# Patient Record
Sex: Male | Born: 1955 | Race: White | Hispanic: No | Marital: Married | Smoking: Never smoker
Health system: Southern US, Community
[De-identification: ages and names within clinical notes are randomized; demographics above are authoritative.]

## PROBLEM LIST (undated history)

## (undated) DIAGNOSIS — E039 Hypothyroidism, unspecified: Secondary | ICD-10-CM

## (undated) HISTORY — PX: WISDOM TOOTH EXTRACTION: SHX21

## (undated) HISTORY — PX: HERNIA REPAIR: SHX51

## (undated) HISTORY — PX: VASECTOMY: SHX75

---

## 2007-11-09 ENCOUNTER — Ambulatory Visit: Payer: Self-pay | Admitting: Internal Medicine

## 2010-05-18 ENCOUNTER — Ambulatory Visit: Payer: Self-pay | Admitting: Surgery

## 2011-08-06 ENCOUNTER — Ambulatory Visit: Payer: Self-pay | Admitting: Gastroenterology

## 2011-08-06 LAB — HM COLONOSCOPY: HM Colonoscopy: NORMAL

## 2013-03-12 LAB — HM HEPATITIS C SCREENING LAB: HM HEPATITIS C SCREENING: NEGATIVE

## 2014-05-07 LAB — TSH: TSH: 3.21 u[IU]/mL (ref 0.41–5.90)

## 2014-05-07 LAB — LIPID PANEL
CHOLESTEROL: 225 mg/dL — AB (ref 0–200)
HDL: 70 mg/dL (ref 35–70)
LDL Cholesterol: 138 mg/dL
LDL/HDL RATIO: 2
TRIGLYCERIDES: 85 mg/dL (ref 40–160)

## 2014-05-07 LAB — BASIC METABOLIC PANEL
BUN: 16 mg/dL (ref 4–21)
Creatinine: 1.1 mg/dL (ref 0.6–1.3)
Glucose: 90 mg/dL
POTASSIUM: 4.3 mmol/L (ref 3.4–5.3)
SODIUM: 143 mmol/L (ref 137–147)

## 2014-05-07 LAB — HEPATIC FUNCTION PANEL
ALT: 16 U/L (ref 10–40)
AST: 22 U/L (ref 14–40)
Alkaline Phosphatase: 51 U/L (ref 25–125)
Bilirubin, Total: 1.2 mg/dL

## 2014-05-07 LAB — CBC AND DIFFERENTIAL
HEMATOCRIT: 44 % (ref 41–53)
HEMOGLOBIN: 15.3 g/dL (ref 13.5–17.5)
Neutrophils Absolute: 5 /uL
Platelets: 229 10*3/uL (ref 150–399)
WBC: 7.5 10^3/mL

## 2014-05-07 LAB — PSA: PSA: 1.5

## 2015-05-06 DIAGNOSIS — K469 Unspecified abdominal hernia without obstruction or gangrene: Secondary | ICD-10-CM | POA: Insufficient documentation

## 2015-05-12 ENCOUNTER — Ambulatory Visit (INDEPENDENT_AMBULATORY_CARE_PROVIDER_SITE_OTHER): Payer: BLUE CROSS/BLUE SHIELD | Admitting: Family Medicine

## 2015-05-12 ENCOUNTER — Encounter: Payer: Self-pay | Admitting: Family Medicine

## 2015-05-12 VITALS — BP 122/76 | HR 64 | Temp 98.0°F | Resp 16 | Ht 72.0 in | Wt 187.0 lb

## 2015-05-12 DIAGNOSIS — Z1211 Encounter for screening for malignant neoplasm of colon: Secondary | ICD-10-CM

## 2015-05-12 DIAGNOSIS — Z125 Encounter for screening for malignant neoplasm of prostate: Secondary | ICD-10-CM

## 2015-05-12 DIAGNOSIS — J309 Allergic rhinitis, unspecified: Secondary | ICD-10-CM

## 2015-05-12 DIAGNOSIS — Z0000 Encounter for general adult medical examination without abnormal findings: Secondary | ICD-10-CM

## 2015-05-12 LAB — POCT URINALYSIS DIPSTICK
BILIRUBIN UA: NEGATIVE
GLUCOSE UA: NEGATIVE
Ketones, UA: NEGATIVE
LEUKOCYTES UA: NEGATIVE
NITRITE UA: NEGATIVE
PH UA: 6
Protein, UA: NEGATIVE
RBC UA: NEGATIVE
Spec Grav, UA: 1.02
UROBILINOGEN UA: 0.2

## 2015-05-12 LAB — IFOBT (OCCULT BLOOD): IMMUNOLOGICAL FECAL OCCULT BLOOD TEST: NEGATIVE

## 2015-05-12 MED ORDER — LORATADINE 10 MG PO TABS: 10.0000 mg | ORAL_TABLET | Freq: Every day | ORAL | Status: DC

## 2015-05-12 MED ORDER — MONTELUKAST SODIUM 10 MG PO TABS: 10.0000 mg | ORAL_TABLET | Freq: Every day | ORAL | Status: DC

## 2015-05-12 NOTE — Progress Notes (Signed)
Patient ID: ZABDIEL DRIPPS, male   DOB: [birth year], 59 y.o.   MRN: [id number]       Patient: Peter Duncan, Male    DOB: 1955/11/09, 59 y.o.   MRN: [id number] Visit Date: 05/12/2015  Today's Provider: [name], MD   Chief Complaint  Patient presents with  . Annual Exam   Subjective:    Annual physical exam Peter Duncan is a 59 y.o. male who presents today for health maintenance and complete physical. He feels well. He reports he is not exercising. He reports he is sleeping well.  ----------------------------------------------------------------- Colonoscopy- 08/06/11 Normal repeat 10 years Tdap- 03/04/11 Zoster 04/23/13   Review of Systems  Constitutional: Negative.   HENT: Positive for postnasal drip.   Eyes: Negative.   Respiratory: Negative.   Cardiovascular: Negative.   Gastrointestinal: Negative.   Endocrine: Negative.   Genitourinary: Negative.   Musculoskeletal: Negative.   Skin: Negative.   Allergic/Immunologic: Negative.   Neurological: Negative.   Hematological: Negative.   Psychiatric/Behavioral: Negative.     Social History      He  reports that he has never smoked. He does not have any smokeless tobacco history on file. He reports that he does not drink alcohol or use illicit drugs.       Social History   Social History  . Marital Status: Married    Spouse Name: N/A  . Number of Children: N/A  . Years of Education: N/A   Social History Main Topics  . Smoking status: Never Smoker   . Smokeless tobacco: None  . Alcohol Use: No  . Drug Use: No  . Sexual Activity: Not Asked   Other Topics Concern  . None   Social History Narrative    Patient Active Problem List   Diagnosis Date Noted  . Abdominal hernia 05/06/2015    Past Surgical History  Procedure Laterality Date  . Vasectomy      Family History        Family Status  Relation Status Death Age  . Mother Alive   . Father Deceased   . Brother Alive     His family history includes COPD in his father; Diabetes in his mother; Heart disease in his father; Melanoma in his father.    No Known Allergies  Previous Medications   FEXOFENADINE (ALLEGRA) 180 MG TABLET    Take by mouth.    Patient Care Team: Maple Hudson., MD as PCP - General (Family Medicine)     Objective:   Vitals: BP 122/76 mmHg  Pulse 64  Temp(Src) 98 F (36.7 C) (Oral)  Resp 16  Ht 6' (1.829 m)  Wt 187 lb (84.823 kg)  BMI 25.36 kg/m2   Physical Exam  Constitutional: He is oriented to person, place, and time. He appears well-developed and well-nourished.  HENT:  Head: Normocephalic and atraumatic.  Right Ear: External ear normal.  Left Ear: External ear normal.  Nose: Nose normal.  Mouth/Throat: Oropharynx is clear and moist.  Eyes: Conjunctivae and EOM are normal. Pupils are equal, round, and reactive to light.  Neck: Normal range of motion. Neck supple.  Cardiovascular: Normal rate, regular rhythm, normal heart sounds and intact distal pulses.   Pulmonary/Chest: Effort normal and breath sounds normal.  Abdominal: Soft. Bowel sounds are normal.  Genitourinary: Rectum normal, prostate normal and penis normal.  Musculoskeletal: Normal range of motion.  Neurological: He is alert and oriented to person, place, and time. He has normal reflexes.  Skin: Skin is warm and dry.  Psychiatric: He has a normal mood and affect. His behavior is normal. Judgment and thought content normal.     Depression Screen PHQ 2/9 Scores 05/12/2015  PHQ - 2 Score 0      Assessment & Plan:     Routine Health Maintenance and Physical Exam  Exercise Activities and Dietary recommendations Goals    None      Immunization History  Administered Date(s) Administered  . Influenza,inj,Quad PF,36+ Mos 03/14/2015  . Tdap 03/04/2011  . Zoster 06/23/2012    Health Maintenance  Topic Date Due  . Hepatitis C Screening  June 25, 1955  . HIV Screening  04/07/1971  .  INFLUENZA VACCINE  01/13/2016  . TETANUS/TDAP  03/03/2021  . COLONOSCOPY  08/05/2021      Discussed health benefits of physical activity, and encouraged him to engage in regular exercise appropriate for his age and condition.    --------------------------------------------------------------------

## 2015-05-13 LAB — CBC WITH DIFFERENTIAL/PLATELET
BASOS: 0 %
Basophils Absolute: 0 10*3/uL (ref 0.0–0.2)
EOS (ABSOLUTE): 0.1 10*3/uL (ref 0.0–0.4)
EOS: 1 %
HEMATOCRIT: 45.3 % (ref 37.5–51.0)
HEMOGLOBIN: 15.4 g/dL (ref 12.6–17.7)
Immature Grans (Abs): 0 10*3/uL (ref 0.0–0.1)
Immature Granulocytes: 0 %
LYMPHS ABS: 2.4 10*3/uL (ref 0.7–3.1)
Lymphs: 27 %
MCH: 30.5 pg (ref 26.6–33.0)
MCHC: 34 g/dL (ref 31.5–35.7)
MCV: 90 fL (ref 79–97)
MONOCYTES: 9 %
MONOS ABS: 0.8 10*3/uL (ref 0.1–0.9)
NEUTROS ABS: 5.6 10*3/uL (ref 1.4–7.0)
Neutrophils: 63 %
Platelets: 236 10*3/uL (ref 150–379)
RBC: 5.05 x10E6/uL (ref 4.14–5.80)
RDW: 14 % (ref 12.3–15.4)
WBC: 8.8 10*3/uL (ref 3.4–10.8)

## 2015-05-13 LAB — COMPREHENSIVE METABOLIC PANEL
A/G RATIO: 2.1 (ref 1.1–2.5)
ALBUMIN: 4.8 g/dL (ref 3.5–5.5)
ALK PHOS: 58 IU/L (ref 39–117)
ALT: 15 IU/L (ref 0–44)
AST: 20 IU/L (ref 0–40)
BUN / CREAT RATIO: 15 (ref 9–20)
BUN: 16 mg/dL (ref 6–24)
Bilirubin Total: 1 mg/dL (ref 0.0–1.2)
CALCIUM: 9.9 mg/dL (ref 8.7–10.2)
CO2: 28 mmol/L (ref 18–29)
CREATININE: 1.05 mg/dL (ref 0.76–1.27)
Chloride: 98 mmol/L (ref 97–106)
GFR calc Af Amer: 89 mL/min/{1.73_m2} (ref >59)
GFR, EST NON AFRICAN AMERICAN: 77 mL/min/{1.73_m2} (ref >59)
GLOBULIN, TOTAL: 2.3 g/dL (ref 1.5–4.5)
Glucose: 90 mg/dL (ref 65–99)
POTASSIUM: 4.5 mmol/L (ref 3.5–5.2)
SODIUM: 142 mmol/L (ref 136–144)
Total Protein: 7.1 g/dL (ref 6.0–8.5)

## 2015-05-13 LAB — LIPID PANEL WITH LDL/HDL RATIO
Cholesterol, Total: 205 mg/dL — ABNORMAL HIGH (ref 100–199)
HDL: 68 mg/dL (ref >39)
LDL CALC: 114 mg/dL — AB (ref 0–99)
LDL/HDL RATIO: 1.7 ratio (ref 0.0–3.6)
Triglycerides: 116 mg/dL (ref 0–149)
VLDL Cholesterol Cal: 23 mg/dL (ref 5–40)

## 2015-05-13 LAB — TSH: TSH: 6.13 u[IU]/mL — AB (ref 0.450–4.500)

## 2015-05-13 LAB — PSA: Prostate Specific Ag, Serum: 1.4 ng/mL (ref 0.0–4.0)

## 2015-05-15 ENCOUNTER — Telehealth: Payer: Self-pay

## 2015-05-15 NOTE — Telephone Encounter (Signed)
-----   Message from Maple Hudsonichard L Gilbert Jr., MD sent at 05/15/2015  9:39 AM EST ----- Labs ok but now mildly hypothyroid--can Rx with synthroid 25 mcg or just f/u 423months--would f/u 3 months either way.

## 2015-05-15 NOTE — Telephone Encounter (Signed)
Tried calling patient, unable to leave VM. Will try again later.

## 2015-05-19 MED ORDER — LEVOTHYROXINE SODIUM 25 MCG PO CAPS: 1.0000 | ORAL_CAPSULE | Freq: Every day | ORAL | Status: DC

## 2015-05-19 NOTE — Telephone Encounter (Signed)
Pt advised, RX sent in, he will call back to make appt for f/u-aa

## 2015-05-27 ENCOUNTER — Other Ambulatory Visit: Payer: Self-pay

## 2015-05-27 DIAGNOSIS — E039 Hypothyroidism, unspecified: Secondary | ICD-10-CM

## 2015-05-27 MED ORDER — LEVOTHYROXINE SODIUM 25 MCG PO TABS: 25.0000 ug | ORAL_TABLET | Freq: Every day | ORAL | Status: DC

## 2015-05-28 ENCOUNTER — Telehealth: Payer: Self-pay | Admitting: Family Medicine

## 2015-08-27 ENCOUNTER — Ambulatory Visit (INDEPENDENT_AMBULATORY_CARE_PROVIDER_SITE_OTHER): Payer: BLUE CROSS/BLUE SHIELD | Admitting: Family Medicine

## 2015-08-27 ENCOUNTER — Encounter: Payer: Self-pay | Admitting: Family Medicine

## 2015-08-27 VITALS — BP 118/82 | HR 84 | Temp 97.9°F | Resp 18 | Wt 188.0 lb

## 2015-08-27 DIAGNOSIS — J45909 Unspecified asthma, uncomplicated: Secondary | ICD-10-CM | POA: Diagnosis not present

## 2015-08-27 DIAGNOSIS — R05 Cough: Secondary | ICD-10-CM

## 2015-08-27 DIAGNOSIS — R059 Cough, unspecified: Secondary | ICD-10-CM

## 2015-08-27 MED ORDER — PREDNISONE 10 MG (21) PO TBPK: 10.0000 mg | ORAL_TABLET | Freq: Every day | ORAL | Status: DC

## 2015-08-27 MED ORDER — LEVOFLOXACIN 500 MG PO TABS: 500.0000 mg | ORAL_TABLET | Freq: Every day | ORAL | Status: DC

## 2015-08-27 NOTE — Progress Notes (Signed)
Patient ID: Peter Duncan, male   DOB: 09-Oct-1955, 60 y.o.   MRN: [id number]    Subjective:  HPI Pt is here for cough and chest congestion. He was seen at urgent care over this past weekend. He was given Z-pak, pro-air and a cough syrup. He is not feeling any better. He can hear himself wheeze. He denies shortness of breath, or fevers.  Sputum is a yellowish white color.   Prior to Admission medications   Medication Sig Start Date End Date Taking? Authorizing Provider  levothyroxine (SYNTHROID, LEVOTHROID) 25 MCG tablet Take 1 tablet (25 mcg total) by mouth daily before breakfast. 05/27/15  Yes Maple Hudson., MD  loratadine (CLARITIN) 10 MG tablet Take 1 tablet (10 mg total) by mouth daily. 05/12/15  Yes Richard Hulen Shouts., MD  montelukast (SINGULAIR) 10 MG tablet Take 1 tablet (10 mg total) by mouth at bedtime. Patient not taking: Reported on 08/27/2015 05/12/15   Maple Hudson., MD    Patient Active Problem List   Diagnosis Date Noted  . Abdominal hernia 05/06/2015    History reviewed. No pertinent past medical history.  Social History   Social History  . Marital Status: Married    Spouse Name: N/A  . Number of Children: N/A  . Years of Education: N/A   Occupational History  . Not on file.   Social History Main Topics  . Smoking status: Never Smoker   . Smokeless tobacco: Not on file  . Alcohol Use: No  . Drug Use: No  . Sexual Activity: Not on file   Other Topics Concern  . Not on file   Social History Narrative    No Known Allergies  Review of Systems  Constitutional: Positive for malaise/fatigue.  HENT: Negative.   Eyes: Negative.   Respiratory: Positive for cough and sputum production (yellow/white color).   Cardiovascular: Negative.   Gastrointestinal: Negative.   Genitourinary: Negative.   Musculoskeletal: Negative.   Skin: Negative.   Neurological: Negative.   Endo/Heme/Allergies: Negative.   Psychiatric/Behavioral: Negative.       Immunization History  Administered Date(s) Administered  . Influenza,inj,Quad PF,36+ Mos 03/14/2015  . Tdap 03/04/2011  . Zoster 06/23/2012   Objective:  BP 118/82 mmHg  Pulse 84  Temp(Src) 97.9 F (36.6 C) (Oral)  Resp 18  Wt 188 lb (85.276 kg)  SpO2 98%  Physical Exam  Constitutional: He is oriented to person, place, and time and well-developed, well-nourished, and in no distress.  HENT:  Head: Normocephalic and atraumatic.  Right Ear: External ear normal.  Left Ear: External ear normal.  Nose: Nose normal.  Eyes: Conjunctivae are normal.  Neck: Neck supple.  Cardiovascular: Normal rate, regular rhythm, normal heart sounds and intact distal pulses.   Pulmonary/Chest: Effort normal and breath sounds normal.  Abdominal: Soft.  Neurological: He is alert and oriented to person, place, and time.  Skin: Skin is warm and dry.  Psychiatric: Mood, memory, affect and judgment normal.    Lab Results  Component Value Date   WBC 8.8 05/12/2015   HGB 15.3 05/07/2014   HCT 45.3 05/12/2015   PLT 236 05/12/2015   GLUCOSE 90 05/12/2015   CHOL 205* 05/12/2015   TRIG 116 05/12/2015   HDL 68 05/12/2015   LDLCALC 114* 05/12/2015   TSH 6.130* 05/12/2015   PSA 1.5 05/07/2014    CMP     Component Value Date/Time   NA 142 05/12/2015 1512   K 4.5 05/12/2015 1512  CL 98 05/12/2015 1512   CO2 28 05/12/2015 1512   GLUCOSE 90 05/12/2015 1512   BUN 16 05/12/2015 1512   CREATININE 1.05 05/12/2015 1512   CREATININE 1.1 05/07/2014   CALCIUM 9.9 05/12/2015 1512   PROT 7.1 05/12/2015 1512   ALBUMIN 4.8 05/12/2015 1512   AST 20 05/12/2015 1512   ALT 15 05/12/2015 1512   ALKPHOS 58 05/12/2015 1512   BILITOT 1.0 05/12/2015 1512   GFRNONAA 77 05/12/2015 1512   GFRAA 89 05/12/2015 1512    Assessment and Plan :  1. Cough   2. Asthmatic bronchitis, unspecified asthma severity, uncomplicated  - predniSONE (STERAPRED UNI-PAK 21 TAB) 10 MG (21) TBPK tablet; Take 1 tablet (10 mg  total) by mouth daily.  Dispense: 21 tablet; Refill: 0 - levofloxacin (LEVAQUIN) 500 MG tablet; Take 1 tablet (500 mg total) by mouth daily.  Dispense: 7 tablet; Refill: 0   Julieanne Mansonichard Gilbert MD [facility] 08/27/2015 3:39 PM

## 2015-08-28 ENCOUNTER — Other Ambulatory Visit: Payer: Self-pay

## 2015-08-28 DIAGNOSIS — J45901 Unspecified asthma with (acute) exacerbation: Secondary | ICD-10-CM

## 2015-08-28 MED ORDER — ALBUTEROL SULFATE HFA 108 (90 BASE) MCG/ACT IN AERS
1.0000 | INHALATION_SPRAY | RESPIRATORY_TRACT | Status: DC | PRN
Start: 2015-08-28 — End: 2017-08-18

## 2015-08-28 MED ORDER — ALBUTEROL SULFATE HFA 108 (90 BASE) MCG/ACT IN AERS: 1.0000 | INHALATION_SPRAY | RESPIRATORY_TRACT | Status: DC | PRN

## 2015-10-02 ENCOUNTER — Other Ambulatory Visit: Payer: Self-pay | Admitting: Family Medicine

## 2015-11-05 ENCOUNTER — Ambulatory Visit (INDEPENDENT_AMBULATORY_CARE_PROVIDER_SITE_OTHER): Payer: BLUE CROSS/BLUE SHIELD | Admitting: Family Medicine

## 2015-11-05 ENCOUNTER — Encounter: Payer: Self-pay | Admitting: Family Medicine

## 2015-11-05 VITALS — BP 112/70 | HR 98 | Temp 98.4°F | Resp 16 | Wt 188.0 lb

## 2015-11-05 DIAGNOSIS — J40 Bronchitis, not specified as acute or chronic: Secondary | ICD-10-CM | POA: Diagnosis not present

## 2015-11-05 MED ORDER — DOXYCYCLINE HYCLATE 100 MG PO TABS: 100.0000 mg | ORAL_TABLET | Freq: Two times a day (BID) | ORAL | Status: DC

## 2015-11-05 NOTE — Patient Instructions (Signed)
Add Mucinex for congestion and Delsym for daytime cough. Continue to use narcotic cough syrup for night time cough. If symptoms not improving over the next few days may add antibiotic.

## 2015-11-05 NOTE — Progress Notes (Signed)
Subjective:     Patient ID: Peter Duncan, male   DOB: Dec 28, 1955, 60 y.o.   MRN: [id number]  HPI  Chief Complaint  Patient presents with  . Cough    Patient comes in office today with concerns of cough and congestion for the past two weeks, he denies symptoms of shortness of breath of wheezing. No other associated URI symptoms with cough, patient reports that he has been taking otc cough syrup and using Pro air inhaler for relief.   States he is bringing up little sputum which he describes as green. No hx of asthma or smoking. Remains on Singulair for allergies. Has left over rx cough syrup at home.   Review of Systems     Objective:   Physical Exam  Constitutional: He appears well-developed and well-nourished. No distress.  Ears: T.M's intact without inflammation Throat: no tonsillar enlargement or exudate Neck: no cervical adenopathy Lungs: Posterior  Basilar coarse crackles     Assessment:    1. Bronchitis - doxycycline (VIBRA-TABS) 100 MG tablet; Take 1 tablet (100 mg total) by mouth 2 (two) times daily.  Dispense: 14 tablet; Refill: 0    Plan:    Discussed use of Mucinex and Delsym. If cough not improving over the next few days to start abx.

## 2016-03-26 DIAGNOSIS — Z23 Encounter for immunization: Secondary | ICD-10-CM | POA: Diagnosis not present

## 2016-05-12 ENCOUNTER — Ambulatory Visit (INDEPENDENT_AMBULATORY_CARE_PROVIDER_SITE_OTHER): Payer: BLUE CROSS/BLUE SHIELD | Admitting: Family Medicine

## 2016-05-12 ENCOUNTER — Encounter: Payer: Self-pay | Admitting: Family Medicine

## 2016-05-12 VITALS — BP 116/78 | HR 72 | Temp 98.2°F | Resp 16 | Ht 71.25 in | Wt 185.6 lb

## 2016-05-12 DIAGNOSIS — Z0000 Encounter for general adult medical examination without abnormal findings: Secondary | ICD-10-CM

## 2016-05-12 DIAGNOSIS — Z125 Encounter for screening for malignant neoplasm of prostate: Secondary | ICD-10-CM | POA: Diagnosis not present

## 2016-05-12 DIAGNOSIS — R0681 Apnea, not elsewhere classified: Secondary | ICD-10-CM | POA: Diagnosis not present

## 2016-05-12 DIAGNOSIS — Z1211 Encounter for screening for malignant neoplasm of colon: Secondary | ICD-10-CM

## 2016-05-12 LAB — POCT URINALYSIS DIPSTICK
Bilirubin, UA: NEGATIVE
Blood, UA: NEGATIVE
Glucose, UA: NEGATIVE
Ketones, UA: NEGATIVE
LEUKOCYTES UA: NEGATIVE
NITRITE UA: NEGATIVE
PROTEIN UA: NEGATIVE
UROBILINOGEN UA: 0.2
pH, UA: 6

## 2016-05-12 LAB — IFOBT (OCCULT BLOOD): IMMUNOLOGICAL FECAL OCCULT BLOOD TEST: NEGATIVE

## 2016-05-12 NOTE — Progress Notes (Signed)
Patient: Peter Duncan, Male    DOB: 1955/10/13, 60 y.o.   MRN: [id number] Visit Date: 05/12/2016  Today's Provider: Megan Mansichard Gilbert Jr, MD   Chief Complaint  Patient presents with  . Annual Exam   Subjective:    Annual physical exam Peter CongGregory L Duncan is a 60 y.o. male who presents today for health maintenance and complete physical. He feels well. He reports exercising. He reports he is sleeping poor to fair. Pt says he snores and wife says he has apneic spells during the  night.  ----------------------------------------------------------------- Colonoscopy: 08/06/2011- Normal  Epworth Score: 12   Review of Systems  Constitutional: Negative.   HENT: Negative.   Eyes: Negative.   Respiratory: Negative.   Cardiovascular: Negative.   Gastrointestinal: Negative.   Endocrine: Negative.   Genitourinary: Negative.   Musculoskeletal: Negative.   Skin: Negative.   Neurological: Negative.   Hematological: Negative.   Psychiatric/Behavioral: Positive for sleep disturbance.    Social History      He  reports that he has never smoked. He does not have any smokeless tobacco history on file. He reports that he does not drink alcohol or use drugs.       Social History   Social History  . Marital status: Married    Spouse name: N/A  . Number of children: N/A  . Years of education: N/A   Social History Main Topics  . Smoking status: Never Smoker  . Smokeless tobacco: None  . Alcohol use No  . Drug use: No  . Sexual activity: Not Asked   Other Topics Concern  . None   Social History Narrative  . None    No past medical history on file.   Patient Active Problem List   Diagnosis Date Noted  . Abdominal hernia 05/06/2015    Past Surgical History:  Procedure Laterality Date  . VASECTOMY      Family History        Family Status  Relation Status  . Mother Alive  . Father Deceased  . Brother Alive        His family history includes COPD in his  father; Diabetes in his mother; Heart disease in his father; Melanoma in his father.     No Known Allergies   Current Outpatient Prescriptions:  .  albuterol (PROAIR HFA) 108 (90 Base) MCG/ACT inhaler, Inhale 1-2 puffs into the lungs every 4 (four) hours as needed for wheezing or shortness of breath., Disp: 1 Inhaler, Rfl: 12 .  levothyroxine (SYNTHROID, LEVOTHROID) 25 MCG tablet, TAKE ONE TABLET BY MOUTH ONCE DAILY BEFORE BREAKFAST, Disp: 30 tablet, Rfl: 12 .  loratadine (CLARITIN) 10 MG tablet, Take 1 tablet (10 mg total) by mouth daily., Disp: 30 tablet, Rfl: 12 .  montelukast (SINGULAIR) 10 MG tablet, Take 1 tablet (10 mg total) by mouth at bedtime., Disp: 30 tablet, Rfl: 12   Patient Care Team: Maple Hudsonichard L Gilbert Jr., MD as PCP - General (Family Medicine)      Objective:   Vitals: BP 116/78 (BP Location: Right Arm, Patient Position: Sitting, Cuff Size: Normal)   Pulse 72   Temp 98.2 F (36.8 C) (Oral)   Resp 16   Ht 5' 11.25" (1.81 m)   Wt 185 lb 9.6 oz (84.2 kg)   BMI 25.70 kg/m    Physical Exam  Constitutional: He is oriented to person, place, and time. He appears well-developed and well-nourished.  HENT:  Head: Normocephalic and atraumatic.  Right Ear: External ear normal.  Left Ear: External ear normal.  Mouth/Throat: Oropharynx is clear and moist.  Eyes: Conjunctivae and EOM are normal. Pupils are equal, round, and reactive to light.  Neck: Normal range of motion. Neck supple.  Cardiovascular: Normal rate, regular rhythm, normal heart sounds and intact distal pulses.   Pulmonary/Chest: Effort normal and breath sounds normal.  Abdominal: Soft. Bowel sounds are normal.  Musculoskeletal: Normal range of motion.  Neurological: He is alert and oriented to person, place, and time. He has normal reflexes.  Skin: Skin is warm and dry.  Psychiatric: He has a normal mood and affect. His behavior is normal. Judgment and thought content normal.     Depression Screen PHQ  2/9 Scores 05/12/2015  PHQ - 2 Score 0      Assessment & Plan:     Routine Health Maintenance and Physical Exam  Exercise Activities and Dietary recommendations Goals    None      Immunization History  Administered Date(s) Administered  . Influenza,inj,Quad PF,36+ Mos 03/14/2015  . Influenza,inj,quad, With Preservative 03/26/2016  . Tdap 03/04/2011  . Zoster 06/23/2012    Health Maintenance  Topic Date Due  . Hepatitis C Screening  Jan 03, 1956  . HIV Screening  04/07/1971  . TETANUS/TDAP  03/03/2021  . COLONOSCOPY  08/05/2021  . INFLUENZA VACCINE  Completed  . ZOSTAVAX  Completed     Discussed health benefits of physical activity, and encouraged him to engage in regular exercise appropriate for his age and condition.  OSA -likely with Epworth of 12 ,snoring,apneic spells. Obtain sleep sutdy.     --------------------------------------------------------------------  I have done the exam and reviewed the above chart and it is accurate to the best of my knowledge. [employer]  technology has been used in this note in any air is in the dictation or transcription are unintentional.   Megan Mansichard Gilbert Jr, MD  [facility]

## 2016-05-13 ENCOUNTER — Telehealth: Payer: Self-pay

## 2016-05-13 LAB — COMPREHENSIVE METABOLIC PANEL
A/G RATIO: 2 (ref 1.2–2.2)
ALK PHOS: 52 IU/L (ref 39–117)
ALT: 20 IU/L (ref 0–44)
AST: 22 IU/L (ref 0–40)
Albumin: 4.5 g/dL (ref 3.6–4.8)
BILIRUBIN TOTAL: 0.8 mg/dL (ref 0.0–1.2)
BUN/Creatinine Ratio: 17 (ref 10–24)
BUN: 19 mg/dL (ref 8–27)
CO2: 27 mmol/L (ref 18–29)
Calcium: 10 mg/dL (ref 8.6–10.2)
Chloride: 100 mmol/L (ref 96–106)
Creatinine, Ser: 1.15 mg/dL (ref 0.76–1.27)
GFR calc Af Amer: 80 mL/min/{1.73_m2} (ref >59)
GFR calc non Af Amer: 69 mL/min/{1.73_m2} (ref >59)
GLOBULIN, TOTAL: 2.2 g/dL (ref 1.5–4.5)
Glucose: 86 mg/dL (ref 65–99)
POTASSIUM: 4.8 mmol/L (ref 3.5–5.2)
SODIUM: 143 mmol/L (ref 134–144)
Total Protein: 6.7 g/dL (ref 6.0–8.5)

## 2016-05-13 LAB — CBC WITH DIFFERENTIAL
BASOS ABS: 0 10*3/uL (ref 0.0–0.2)
Basos: 0 %
EOS (ABSOLUTE): 0.1 10*3/uL (ref 0.0–0.4)
Eos: 2 %
Hematocrit: 42 % (ref 37.5–51.0)
Hemoglobin: 14.3 g/dL (ref 12.6–17.7)
IMMATURE GRANS (ABS): 0 10*3/uL (ref 0.0–0.1)
Immature Granulocytes: 0 %
LYMPHS ABS: 2.7 10*3/uL (ref 0.7–3.1)
LYMPHS: 32 %
MCH: 31.4 pg (ref 26.6–33.0)
MCHC: 34 g/dL (ref 31.5–35.7)
MCV: 92 fL (ref 79–97)
Monocytes Absolute: 0.7 10*3/uL (ref 0.1–0.9)
Monocytes: 9 %
NEUTROS ABS: 4.7 10*3/uL (ref 1.4–7.0)
NEUTROS PCT: 57 %
RBC: 4.55 x10E6/uL (ref 4.14–5.80)
RDW: 14.2 % (ref 12.3–15.4)
WBC: 8.2 10*3/uL (ref 3.4–10.8)

## 2016-05-13 LAB — LIPID PANEL
CHOLESTEROL TOTAL: 211 mg/dL — AB (ref 100–199)
Chol/HDL Ratio: 2.7 ratio units (ref 0.0–5.0)
HDL: 78 mg/dL (ref >39)
LDL Calculated: 122 mg/dL — ABNORMAL HIGH (ref 0–99)
TRIGLYCERIDES: 53 mg/dL (ref 0–149)
VLDL Cholesterol Cal: 11 mg/dL (ref 5–40)

## 2016-05-13 LAB — TSH: TSH: 3.8 u[IU]/mL (ref 0.450–4.500)

## 2016-05-13 LAB — PSA: Prostate Specific Ag, Serum: 1.4 ng/mL (ref 0.0–4.0)

## 2016-05-13 NOTE — Telephone Encounter (Signed)
Patients wife was advised, she would like to know if Dr. Sullivan LoneGilbert has completed forms and when will they be available for pick up? Wife is also requesting that patients Synthroid be refilled as a 90 day prescription.KW

## 2016-05-13 NOTE — Telephone Encounter (Signed)
-----   Message from Maple Hudsonichard L Gilbert Jr., MD sent at 05/13/2016  9:23 AM EST ----- Labs OK

## 2016-05-13 NOTE — Telephone Encounter (Signed)
Monday will be done

## 2016-05-13 NOTE — Telephone Encounter (Signed)
Patients wife was advised. KW 

## 2016-05-14 ENCOUNTER — Other Ambulatory Visit: Payer: Self-pay | Admitting: Emergency Medicine

## 2016-05-14 MED ORDER — LORATADINE 10 MG PO TABS: 10.0000 mg | ORAL_TABLET | Freq: Every day | ORAL | 3 refills | Status: DC

## 2016-06-28 ENCOUNTER — Telehealth: Payer: Self-pay | Admitting: Family Medicine

## 2016-06-28 NOTE — Telephone Encounter (Signed)
FYI--Per Lillia AbedLindsay at [facility]Their office has made several attempts to contact pt including mailing a letter to pt's home.Pt has not responded

## 2016-06-28 NOTE — Telephone Encounter (Signed)
See below-aa 

## 2016-10-04 ENCOUNTER — Other Ambulatory Visit: Payer: Self-pay | Admitting: Family Medicine

## 2017-03-20 DIAGNOSIS — Z23 Encounter for immunization: Secondary | ICD-10-CM | POA: Diagnosis not present

## 2017-06-01 ENCOUNTER — Other Ambulatory Visit: Payer: Self-pay

## 2017-06-01 MED ORDER — LEVOTHYROXINE SODIUM 25 MCG PO TABS: ORAL_TABLET | ORAL | 0 refills | Status: DC

## 2017-06-09 ENCOUNTER — Ambulatory Visit (INDEPENDENT_AMBULATORY_CARE_PROVIDER_SITE_OTHER): Payer: BLUE CROSS/BLUE SHIELD | Admitting: Family Medicine

## 2017-06-09 ENCOUNTER — Other Ambulatory Visit: Payer: Self-pay

## 2017-06-09 VITALS — BP 114/78 | HR 96 | Temp 98.4°F | Resp 14 | Wt 188.0 lb

## 2017-06-09 DIAGNOSIS — Z125 Encounter for screening for malignant neoplasm of prostate: Secondary | ICD-10-CM

## 2017-06-09 DIAGNOSIS — Z1211 Encounter for screening for malignant neoplasm of colon: Secondary | ICD-10-CM | POA: Diagnosis not present

## 2017-06-09 DIAGNOSIS — Z0000 Encounter for general adult medical examination without abnormal findings: Secondary | ICD-10-CM | POA: Diagnosis not present

## 2017-06-09 LAB — POCT URINALYSIS DIPSTICK
BILIRUBIN UA: NEGATIVE
Blood, UA: NEGATIVE
GLUCOSE UA: NEGATIVE
KETONES UA: NEGATIVE
Leukocytes, UA: NEGATIVE
Nitrite, UA: NEGATIVE
PH UA: 6.5 (ref 5.0–8.0)
Protein, UA: NEGATIVE
Spec Grav, UA: 1.01 (ref 1.010–1.025)
UROBILINOGEN UA: 0.2 U/dL

## 2017-06-09 LAB — IFOBT (OCCULT BLOOD): IFOBT: NEGATIVE

## 2017-06-09 NOTE — Progress Notes (Signed)
Patient: Peter Duncan, Male    DOB: 03-Jun-1956, 61 y.o.   MRN: [id number] Visit Date: 06/09/2017  Today's Provider: Megan Mansichard Landry Lookingbill Jr, MD   Chief Complaint  Patient presents with  . Annual Exam   Subjective:  Peter CongGregory L Mollenkopf is a 61 y.o. male who presents today for health maintenance and complete physical. He feels well. He reports exercising weekly. He reports he is sleeping poorly.He feels well. His wife retired a few years ago and he plans to retire October 2019.  Immunization History  Administered Date(s) Administered  . Influenza Inj Mdck Quad Pf 03/20/2017  . Influenza,inj,Quad PF,6+ Mos 03/14/2015  . Influenza,inj,quad, With Preservative 03/26/2016  . Tdap 03/04/2011  . Zoster 06/23/2012   08/06/11 Colonoscopy, Dr Ellard Artish-normal, repeat 2023  Review of Systems  Constitutional: Negative.   HENT: Negative.   Eyes: Negative.   Respiratory: Negative.   Cardiovascular: Negative.   Gastrointestinal: Negative.   Endocrine: Negative.   Genitourinary: Negative.   Musculoskeletal: Negative.   Skin: Negative.   Allergic/Immunologic: Negative.   Neurological: Negative.   Hematological: Negative.   Psychiatric/Behavioral: Negative.     Social History   Socioeconomic History  . Marital status: Married    Spouse name: Not on file  . Number of children: Not on file  . Years of education: Not on file  . Highest education level: Not on file  Social Needs  . Financial resource strain: Not on file  . Food insecurity - worry: Not on file  . Food insecurity - inability: Not on file  . Transportation needs - medical: Not on file  . Transportation needs - non-medical: Not on file  Occupational History  . Not on file  Tobacco Use  . Smoking status: Never Smoker  Substance and Sexual Activity  . Alcohol use: No  . Drug use: No  . Sexual activity: Not on file  Other Topics Concern  . Not on file  Social History Narrative  . Not on file    Patient Active Problem List   Diagnosis Date Noted  . Witnessed episode of apnea 05/12/2016  . Abdominal hernia 05/06/2015    Past Surgical History:  Procedure Laterality Date  . VASECTOMY      His family history includes COPD in his father; Diabetes in his mother; Heart disease in his father; Melanoma in his father.     Outpatient Encounter Medications as of 06/09/2017  Medication Sig  . albuterol (PROAIR HFA) 108 (90 Base) MCG/ACT inhaler Inhale 1-2 puffs into the lungs every 4 (four) hours as needed for wheezing or shortness of breath.  . levothyroxine (SYNTHROID, LEVOTHROID) 25 MCG tablet TAKE ONE TABLET BY MOUTH ONCE DAILY BEFORE BREAKFAST  . loratadine (CLARITIN) 10 MG tablet Take 1 tablet (10 mg total) by mouth daily.  . montelukast (SINGULAIR) 10 MG tablet Take 1 tablet (10 mg total) by mouth at bedtime.   No facility-administered encounter medications on file as of 06/09/2017.     Patient Care Team: Maple HudsonGilbert, Leomar Westberg L Jr., MD as PCP - General (Family Medicine)      Objective:   Vitals:  Vitals:   06/09/17 1419  BP: 114/78  Pulse: 96  Resp: 14  Temp: 98.4 F (36.9 C)  TempSrc: Oral  Weight: 188 lb (85.3 kg)    Physical Exam  Constitutional: He is oriented to person, place, and time. He appears well-developed and well-nourished.  HENT:  Head: Normocephalic and atraumatic.  Right Ear: External ear normal.  Left Ear: External  ear normal.  Nose: Nose normal.  Mouth/Throat: Oropharynx is clear and moist.  Eyes: Conjunctivae and EOM are normal. Pupils are equal, round, and reactive to light.  Neck: Normal range of motion. Neck supple.  Cardiovascular: Normal rate, regular rhythm, normal heart sounds and intact distal pulses.  LE varicose veins present.  Pulmonary/Chest: Effort normal and breath sounds normal.  Abdominal: Soft. Bowel sounds are normal.  Genitourinary: Rectum normal, prostate normal and penis normal.  Musculoskeletal: Normal range of motion.  Neurological: He is alert and  oriented to person, place, and time.  Skin: Skin is warm and dry.  Pt has several stable lipomas.  Psychiatric: He has a normal mood and affect. His behavior is normal. Judgment and thought content normal.   Fall Risk  06/09/2017 05/12/2015  Falls in the past year? No No    Depression Screen PHQ 2/9 Scores 06/09/2017 05/12/2015  PHQ - 2 Score 2 0  PHQ- 9 Score 5 -   Functional Status Survey: Is the patient deaf or have difficulty hearing?: No Does the patient have difficulty seeing, even when wearing glasses/contacts?: No Does the patient have difficulty concentrating, remembering, or making decisions?: No Does the patient have difficulty walking or climbing stairs?: No Does the patient have difficulty dressing or bathing?: No Does the patient have difficulty doing errands alone such as visiting a doctor's office or shopping?: No  Current Exercise Habits: Home exercise routine, Frequency (Times/Week): 1      Assessment & Plan:     Routine Health Maintenance and Physical Exam  Exercise Activities and Dietary recommendations Goals    None      Immunization History  Administered Date(s) Administered  . Influenza Inj Mdck Quad Pf 03/20/2017  . Influenza,inj,Quad PF,6+ Mos 03/14/2015  . Influenza,inj,quad, With Preservative 03/26/2016  . Tdap 03/04/2011  . Zoster 06/23/2012    Health Maintenance  Topic Date Due  . Hepatitis C Screening  11-Feb-1956  . HIV Screening  04/07/1971  . TETANUS/TDAP  03/03/2021  . COLONOSCOPY  08/05/2021  . INFLUENZA VACCINE  Completed     Discussed health benefits of physical activity, and encouraged him to engage in regular exercise appropriate for his age and condition.  RTC 1 year.

## 2017-06-10 LAB — CBC WITH DIFFERENTIAL/PLATELET
Basophils Absolute: 41 cells/uL (ref 0–200)
Basophils Relative: 0.6 %
EOS PCT: 3.2 %
Eosinophils Absolute: 221 cells/uL (ref 15–500)
HCT: 44.5 % (ref 38.5–50.0)
Hemoglobin: 15.2 g/dL (ref 13.2–17.1)
Lymphs Abs: 2022 cells/uL (ref 850–3900)
MCH: 30.8 pg (ref 27.0–33.0)
MCHC: 34.2 g/dL (ref 32.0–36.0)
MCV: 90.1 fL (ref 80.0–100.0)
MONOS PCT: 8 %
MPV: 11 fL (ref 7.5–12.5)
NEUTROS PCT: 58.9 %
Neutro Abs: 4064 cells/uL (ref 1500–7800)
PLATELETS: 239 10*3/uL (ref 140–400)
RBC: 4.94 10*6/uL (ref 4.20–5.80)
RDW: 12.9 % (ref 11.0–15.0)
TOTAL LYMPHOCYTE: 29.3 %
WBC mixed population: 552 cells/uL (ref 200–950)
WBC: 6.9 10*3/uL (ref 3.8–10.8)

## 2017-06-10 LAB — COMPLETE METABOLIC PANEL WITH GFR
AG Ratio: 2 (calc) (ref 1.0–2.5)
ALT: 18 U/L (ref 9–46)
AST: 21 U/L (ref 10–35)
Albumin: 4.5 g/dL (ref 3.6–5.1)
Alkaline phosphatase (APISO): 50 U/L (ref 40–115)
BUN: 17 mg/dL (ref 7–25)
CALCIUM: 10 mg/dL (ref 8.6–10.3)
CO2: 31 mmol/L (ref 20–32)
CREATININE: 1.03 mg/dL (ref 0.70–1.25)
Chloride: 102 mmol/L (ref 98–110)
GFR, EST NON AFRICAN AMERICAN: 78 mL/min/{1.73_m2} (ref >=60)
GFR, Est African American: 90 mL/min/{1.73_m2} (ref >=60)
GLOBULIN: 2.3 g/dL (ref 1.9–3.7)
Glucose, Bld: 97 mg/dL (ref 65–99)
Potassium: 4.6 mmol/L (ref 3.5–5.3)
SODIUM: 139 mmol/L (ref 135–146)
Total Bilirubin: 0.8 mg/dL (ref 0.2–1.2)
Total Protein: 6.8 g/dL (ref 6.1–8.1)

## 2017-06-10 LAB — PSA: PSA: 1.7 ng/mL (ref <=4.0)

## 2017-06-10 LAB — TSH: TSH: 2.36 m[IU]/L (ref 0.40–4.50)

## 2017-06-10 LAB — LIPID PANEL
CHOL/HDL RATIO: 3.6 (calc) (ref <5.0)
CHOLESTEROL: 233 mg/dL — AB (ref <200)
HDL: 64 mg/dL (ref >40)
LDL Cholesterol (Calc): 138 mg/dL (calc) — ABNORMAL HIGH
Non-HDL Cholesterol (Calc): 169 mg/dL (calc) — ABNORMAL HIGH (ref <130)
Triglycerides: 170 mg/dL — ABNORMAL HIGH (ref <150)

## 2017-06-11 ENCOUNTER — Other Ambulatory Visit: Payer: Self-pay | Admitting: Family Medicine

## 2017-06-15 ENCOUNTER — Telehealth: Payer: Self-pay

## 2017-06-15 NOTE — Telephone Encounter (Signed)
lmtcb-Aara Jacquot V Judaea Burgoon, RMA  

## 2017-06-15 NOTE — Telephone Encounter (Signed)
-----   Message from Maple Hudsonichard L Gilbert Jr., MD sent at 06/15/2017  8:29 AM EST ----- Labs OK.

## 2017-06-20 NOTE — Telephone Encounter (Signed)
lmtcb-Anastasiya V Hopkins, RMA  

## 2017-06-21 NOTE — Telephone Encounter (Signed)
Patient advised as below.  

## 2017-07-12 ENCOUNTER — Encounter: Payer: Self-pay | Admitting: Family Medicine

## 2017-07-14 ENCOUNTER — Ambulatory Visit (INDEPENDENT_AMBULATORY_CARE_PROVIDER_SITE_OTHER): Payer: BLUE CROSS/BLUE SHIELD | Admitting: Family Medicine

## 2017-07-14 DIAGNOSIS — Z23 Encounter for immunization: Secondary | ICD-10-CM

## 2017-07-14 NOTE — Patient Instructions (Signed)
Please call for an appointment for on or after September 11 2017 for second shingle vaccines

## 2017-07-23 NOTE — Progress Notes (Signed)
Vaccine only

## 2017-08-16 ENCOUNTER — Emergency Department (HOSPITAL_COMMUNITY)
Admission: EM | Admit: 2017-08-16 | Discharge: 2017-08-16 | Disposition: A | Discharge status: Home / Self Care | Payer: Worker's Compensation | Attending: Emergency Medicine | Admitting: Emergency Medicine

## 2017-08-16 ENCOUNTER — Emergency Department (HOSPITAL_COMMUNITY): Payer: Worker's Compensation

## 2017-08-16 ENCOUNTER — Encounter (HOSPITAL_COMMUNITY): Payer: Self-pay

## 2017-08-16 DIAGNOSIS — S82852A Displaced trimalleolar fracture of left lower leg, initial encounter for closed fracture: Secondary | ICD-10-CM | POA: Insufficient documentation

## 2017-08-16 DIAGNOSIS — Y9389 Activity, other specified: Secondary | ICD-10-CM | POA: Insufficient documentation

## 2017-08-16 DIAGNOSIS — Z79899 Other long term (current) drug therapy: Secondary | ICD-10-CM | POA: Insufficient documentation

## 2017-08-16 DIAGNOSIS — Y990 Civilian activity done for income or pay: Secondary | ICD-10-CM | POA: Diagnosis not present

## 2017-08-16 DIAGNOSIS — X500XXA Overexertion from strenuous movement or load, initial encounter: Secondary | ICD-10-CM | POA: Diagnosis not present

## 2017-08-16 DIAGNOSIS — M25572 Pain in left ankle and joints of left foot: Secondary | ICD-10-CM | POA: Diagnosis not present

## 2017-08-16 DIAGNOSIS — Y9289 Other specified places as the place of occurrence of the external cause: Secondary | ICD-10-CM | POA: Insufficient documentation

## 2017-08-16 DIAGNOSIS — S99912A Unspecified injury of left ankle, initial encounter: Secondary | ICD-10-CM | POA: Diagnosis present

## 2017-08-16 MED ORDER — OXYCODONE-ACETAMINOPHEN 5-325 MG PO TABS
1.0000 | ORAL_TABLET | Freq: Once | ORAL | Status: AC
  Administered 2017-08-16: 1 via ORAL
  Filled 2017-08-16: qty 1

## 2017-08-16 MED ORDER — HYDROCODONE-ACETAMINOPHEN 5-325 MG PO TABS: 1.0000 | ORAL_TABLET | ORAL | 0 refills | Status: DC | PRN

## 2017-08-16 NOTE — ED Notes (Signed)
Patient transported to CT 

## 2017-08-16 NOTE — Discharge Instructions (Addendum)
Keep splint intact and dry.  Do not put weight down on left leg.  Keep leg elevated above heart whenever possible.  Ice to ankle 30 minutes 4 times per day.  hydrocodone as needed for pain. Ice affected area (see instructions below).  Please call the orthopedic physician listed today or first thing in the morning to schedule a follow up appointment.   Fractures generally take 4-6 weeks to heal. It is very important to keep your splint dry until your follow up with the orthopedic doctor and a cast can be applied. You may place a plastic bag around the extremity with the splint while bathing to keep it dry. Also try to sleep with the extremity elevated for the next several nights to decrease swelling. Check the fingertips and toes several times per day to make sure they are not cold, pale, or blue. If this is the case, the splint may be too tight and should return to the ER, your regular doctor or the orthopedist for recheck. Return to the ER for new or worsening symptoms, any additional concerns.   COLD THERAPY DIRECTIONS:  Ice or gel packs can be used to reduce both pain and swelling. Ice is the most helpful within the first 24 to 48 hours after an injury or flareup from overusing a muscle or joint.  Ice is effective, has very few side effects, and is safe for most people to use.   If you expose your skin to cold temperatures for too long or without the proper protection, you can damage your skin or nerves. Watch for signs of skin damage due to cold.   HOME CARE INSTRUCTIONS  Follow these tips to use ice and cold packs safely.  Place a dry or damp towel between the ice and skin. A damp towel will cool the skin more quickly, so you may need to shorten the time that the ice is used.  For a more rapid response, add gentle compression to the ice.  Ice for no more than 10 to 20 minutes at a time. The bonier the area you are icing, the less time it will take to get the benefits of ice.  Check your  skin after 5 minutes to make sure there are no signs of a poor response to cold or skin damage.  Rest 20 minutes or more in between uses.  Once your skin is numb, you can end your treatment. You can test numbness by very lightly touching your skin. The touch should be so light that you do not see the skin dimple from the pressure of your fingertip. When using ice, most people will feel these normal sensations in this order: cold, burning, aching, and numbness.

## 2017-08-16 NOTE — Consult Note (Signed)
Reason for Consult:Ankle fx Referring Physician: Thalia BloodgoodS Zachowski  Peter Duncan is an 62 y.o. male.  HPI: Peter LitesGregory was unloading the back of a semi with cookies in which the load had shifted. It came out partially onto him and compressed him. It knocked him down but fortunately did not land on him. He had immediate ankle pain and came to the ED for evaluation. X-rays showed an ankle fx and orthopedic surgery was consulted.  History reviewed. No pertinent past medical history.  Past Surgical History:  Procedure Laterality Date  . VASECTOMY      Family History  Problem Relation Age of Onset  . Diabetes Mother   . COPD Father   . Heart disease Father   . Melanoma Father     Social History:  reports that  has never smoked. he has never used smokeless tobacco. He reports that he does not drink alcohol or use drugs.  Allergies: No Known Allergies  Medications: I have reviewed the patient's current medications.  No results found for this or any previous visit (from the past 48 hour(s)).  Dg Tibia/fibula Left  Result Date: 08/16/2017 CLINICAL DATA:  Fracture, fell twisting LEFT ankle at work today EXAM: LEFT TIBIA AND FIBULA - 2 VIEW COMPARISON:  Preceding LEFT ankle radiographs FINDINGS: Ankle reported separately. Comminuted displaced distal LEFT fibular diaphyseal fracture. Proximal tibia and fibula appear intact. Knee alignment normal. No additional fracture dislocation seen. IMPRESSION: Trimalleolar fractures at the LEFT ankle as previously reported on ankle radiographs. No additional LEFT tibial or fibular abnormalities identified. Electronically Signed   By: Ulyses SouthwardMark  Boles M.D.   On: 08/16/2017 13:07   Dg Ankle Complete Left  Result Date: 08/16/2017 CLINICAL DATA:  Injury unloading a truck.  Initial encounter. EXAM: LEFT ANKLE COMPLETE - 3+ VIEW COMPARISON:  None. FINDINGS: Comminuted fracture of the distal fibular shaft with medial butterfly fragment that is displaced. There is a small  posterior malleolus fracture without measurable articular surface component. Small acute fracture fragments at the medial malleolus without mortise widening on this nonstress view. Generalized soft tissue swelling.  Probable ankle joint effusion. IMPRESSION: 1. Comminuted distal fibular shaft fracture. 2. Small fracture fragments at the medial and posterior malleolus. Electronically Signed   By: Marnee SpringJonathon  Watts M.D.   On: 08/16/2017 12:22   Dg Foot Complete Left  Result Date: 08/16/2017 CLINICAL DATA:  Turned LEFT ankle at work, boxes fell off a truck at work, swelling and deforming at lateral malleolus, initial encounter EXAM: LEFT FOOT - COMPLETE 3+ VIEW COMPARISON:  None FINDINGS: Osseous mineralization normal. Joint spaces preserved. Comminuted displaced fracture of the distal LEFT fibular diaphysis. Small avulsion fragments at tip of medial malleolus. Small posterior malleolar fracture fragment. No additional fracture or dislocation seen within LEFT foot. IMPRESSION: Trimalleolar fractures of the LEFT ankle. No additional LEFT foot fractures identified. Electronically Signed   By: Ulyses SouthwardMark  Boles M.D.   On: 08/16/2017 12:23    Review of Systems  Constitutional: Negative for weight loss.  HENT: Negative for ear discharge, ear pain, hearing loss and tinnitus.   Eyes: Negative for blurred vision, double vision, photophobia and pain.  Respiratory: Negative for cough, sputum production and shortness of breath.   Cardiovascular: Negative for chest pain.  Gastrointestinal: Negative for abdominal pain, nausea and vomiting.  Genitourinary: Negative for dysuria, flank pain, frequency and urgency.  Musculoskeletal: Positive for joint pain (Left ankle). Negative for back pain, falls, myalgias and neck pain.  Neurological: Negative for dizziness, tingling, sensory change,  focal weakness, loss of consciousness and headaches.  Endo/Heme/Allergies: Does not bruise/bleed easily.  Psychiatric/Behavioral: Negative  for depression, memory loss and substance abuse. The patient is not nervous/anxious.    Blood pressure (!) 145/92, pulse 96, temperature 97.7 F (36.5 C), temperature source Oral, resp. rate 18, height 5\' 11"  (1.803 m), weight 85.3 kg (188 lb), SpO2 100 %. Physical Exam  Constitutional: He appears well-developed and well-nourished. No distress.  HENT:  Head: Normocephalic and atraumatic.  Eyes: Conjunctivae are normal. Right eye exhibits no discharge. Left eye exhibits no discharge. No scleral icterus.  Neck: Normal range of motion.  Cardiovascular: Normal rate and regular rhythm.  Respiratory: Effort normal. No respiratory distress.  Musculoskeletal:  LLE No traumatic wounds or rash  Left ankle moderately swollen, TTP, ecchymotic  No knee effusion  Knee stable to varus/ valgus and anterior/posterior stress  Sens DPN, SPN, TN intact  Motor EHL, ext, flex, evers 5/5  DP 2+, could not assess PT 2/2 edema  Neurological: He is alert.  Skin: Skin is warm and dry. He is not diaphoretic.  Psychiatric: He has a normal mood and affect. His behavior is normal.    Assessment/Plan: Blunt trauma Left ankle fx -- Splint and NWB. Will need to ice and elevate at home to reduce swelling. Family/pt requested f/u in [address] so contacted Dr. Ernest Pine who agreed to assume care and can f/u as OP. Thyroid disease    Peter Caldron, PA-C Orthopedic Surgery [phone] 08/16/2017, 1:45 PM

## 2017-08-16 NOTE — ED Provider Notes (Signed)
[facility] Provider Note   CSN: [id number] Arrival date & time: 08/16/17  1111     History   Chief Complaint Chief Complaint  Patient presents with  . Ankle Pain    left    HPI Tonita CongGregory L Duncan is a 62 y.o. male.  HPI 62 year old Caucasian male with no pertinent past medical history presents to the ED with left ankle pain.  Patient states that he was lifting some boxes at work when he lost his footing and twisted his left ankle.  Patient states that he had immediate pain to his left ankle has not been able to bear weight.  Reports swelling and deformity.  Patient arrives by EMS.  Patient denies any fall.  Denies any head injury or LOC.  He denies any paresthesias or weakness.  He is not take anything for the pain.  Pain is worse with movement and ambulation. History reviewed. No pertinent past medical history.  Patient Active Problem List   Diagnosis Date Noted  . Witnessed episode of apnea 05/12/2016  . Abdominal hernia 05/06/2015    Past Surgical History:  Procedure Laterality Date  . VASECTOMY         Home Medications    Prior to Admission medications   Medication Sig Start Date End Date Taking? Authorizing Provider  albuterol (PROAIR HFA) 108 (90 Base) MCG/ACT inhaler Inhale 1-2 puffs into the lungs every 4 (four) hours as needed for wheezing or shortness of breath. 08/28/15   Maple HudsonGilbert, Richard L Jr., MD  levothyroxine (SYNTHROID, LEVOTHROID) 25 MCG tablet TAKE ONE TABLET BY MOUTH ONCE DAILY BEFORE BREAKFAST 06/01/17   Maple HudsonGilbert, Richard L Jr., MD  loratadine (CLARITIN) 10 MG tablet TAKE ONE TABLET BY MOUTH ONCE DAILY 06/13/17   Maple HudsonGilbert, Richard L Jr., MD  montelukast (SINGULAIR) 10 MG tablet Take 1 tablet (10 mg total) by mouth at bedtime. 05/12/15   Maple HudsonGilbert, Richard L Jr., MD    Family History Family History  Problem Relation Age of Onset  . Diabetes Mother   . COPD Father   . Heart disease Father   . Melanoma Father      Social History Social History   Tobacco Use  . Smoking status: Never Smoker  . Smokeless tobacco: Never Used  Substance Use Topics  . Alcohol use: No  . Drug use: No     Allergies   Patient has no known allergies.   Review of Systems Review of Systems  Constitutional: Negative for chills and fever.  Musculoskeletal: Positive for arthralgias, gait problem, joint swelling and myalgias.  Skin: Positive for color change.  Neurological: Negative for weakness and numbness.     Physical Exam Updated Vital Signs BP (!) 145/92   Pulse 99   Temp 97.7 F (36.5 C) (Oral)   Resp 18   Ht 5\' 11"  (1.803 m)   Wt 85.3 kg (188 lb)   SpO2 99%   BMI 26.22 kg/m   Physical Exam  Constitutional: He appears well-developed and well-nourished. No distress.  HENT:  Head: Normocephalic and atraumatic.  Eyes: Right eye exhibits no discharge. Left eye exhibits no discharge. No scleral icterus.  Neck: Normal range of motion.  Cardiovascular: Intact distal pulses.  Pulmonary/Chest: No respiratory distress.  Musculoskeletal: Normal range of motion.  Patient with obvious deformity to the left ankle.  Swelling noted to lateral and medial malleolus.  Tender to palpation.  Mild ecchymosis noted.  DP pulses are 2+ bilaterally.  Sensation intact.  Brisk  cap refill.  Limited range of motion due to pain.  He has no midfoot tenderness.  Pain with range of motion of the left knee.  No hip pain to palpation.  Neurological: He is alert.  Skin: Skin is warm and dry. Capillary refill takes less than 2 seconds. No pallor.  Psychiatric: His behavior is normal. Judgment and thought content normal.  Nursing note and vitals reviewed.    ED Treatments / Results  Labs (all labs ordered are listed, but only abnormal results are displayed) Labs Reviewed - No data to display  EKG  EKG Interpretation None       Radiology Dg Tibia/fibula Left  Result Date: 08/16/2017 CLINICAL DATA:  Fracture, fell  twisting LEFT ankle at work today EXAM: LEFT TIBIA AND FIBULA - 2 VIEW COMPARISON:  Preceding LEFT ankle radiographs FINDINGS: Ankle reported separately. Comminuted displaced distal LEFT fibular diaphyseal fracture. Proximal tibia and fibula appear intact. Knee alignment normal. No additional fracture dislocation seen. IMPRESSION: Trimalleolar fractures at the LEFT ankle as previously reported on ankle radiographs. No additional LEFT tibial or fibular abnormalities identified. Electronically Signed   By: Ulyses Southward M.D.   On: 08/16/2017 13:07   Dg Ankle Complete Left  Result Date: 08/16/2017 CLINICAL DATA:  Injury unloading a truck.  Initial encounter. EXAM: LEFT ANKLE COMPLETE - 3+ VIEW COMPARISON:  None. FINDINGS: Comminuted fracture of the distal fibular shaft with medial butterfly fragment that is displaced. There is a small posterior malleolus fracture without measurable articular surface component. Small acute fracture fragments at the medial malleolus without mortise widening on this nonstress view. Generalized soft tissue swelling.  Probable ankle joint effusion. IMPRESSION: 1. Comminuted distal fibular shaft fracture. 2. Small fracture fragments at the medial and posterior malleolus. Electronically Signed   By: Marnee Spring M.D.   On: 08/16/2017 12:22   Dg Foot Complete Left  Result Date: 08/16/2017 CLINICAL DATA:  Turned LEFT ankle at work, boxes fell off a truck at work, swelling and deforming at lateral malleolus, initial encounter EXAM: LEFT FOOT - COMPLETE 3+ VIEW COMPARISON:  None FINDINGS: Osseous mineralization normal. Joint spaces preserved. Comminuted displaced fracture of the distal LEFT fibular diaphysis. Small avulsion fragments at tip of medial malleolus. Small posterior malleolar fracture fragment. No additional fracture or dislocation seen within LEFT foot. IMPRESSION: Trimalleolar fractures of the LEFT ankle. No additional LEFT foot fractures identified. Electronically Signed    By: Ulyses Southward M.D.   On: 08/16/2017 12:23    Procedures Procedures (including critical care time)  Medications Ordered in ED Medications  oxyCODONE-acetaminophen (PERCOCET/ROXICET) 5-325 MG per tablet 1 tablet (1 tablet Oral Given 08/16/17 1309)     Initial Impression / Assessment and Plan / ED Course  I have reviewed the triage vital signs and the nursing notes.  Pertinent labs & imaging results that were available during my care of the patient were reviewed by me and considered in my medical decision making (see chart for details).     Patient presents the ED with left ankle injury and pain.  Obvious deformity.  Patient neurovascularly intact.  X-ray reveals a tri-malleolar fracture with a comminuted distal fibular shaft fracture.  Discussed with orthopedics Earney Hamburg PA-C.  He recommends splint and nonweightbearing with follow-up with orthopedics in [place].  Patient remains neurovascularly intact after splint placement.  Will given crutches.  Will prescribe pain medicine as requested by orthopedic.  Return precautions discussed with patient.  Encouraged rice therapy at home.  Pt is hemodynamically stable,  in NAD, & able to ambulate in the ED. Evaluation does not show pathology that would require ongoing emergent intervention or inpatient treatment. I explained the diagnosis to the patient. Pain has been managed & has no complaints prior to dc. Pt is comfortable with above plan and is stable for discharge at this time. All questions were answered prior to disposition. Strict return precautions for f/u to the ED were discussed. Encouraged follow up with PCP.   Final Clinical Impressions(s) / ED Diagnoses   Final diagnoses:  Closed trimalleolar fracture of left ankle, initial encounter    ED Discharge Orders        Ordered    HYDROcodone-acetaminophen (NORCO/VICODIN) 5-325 MG tablet  Every 4 hours PRN     08/16/17 1436       Rise Mu, PA-C 08/16/17 1437      Vanetta Mulders, MD 08/17/17 2238

## 2017-08-16 NOTE — ED Triage Notes (Signed)
[facility] was at work unloading a truck and boxes fell and pt turned ankle now with deformity on the left ankle

## 2017-08-16 NOTE — Progress Notes (Signed)
Orthopedic Tech Progress Note Patient Details:  Tonita CongGregory L Kaner 1956/02/26 [id number]  Ortho Devices Type of Ortho Device: Crutches, Short leg splint Ortho Device/Splint Location: left ankle Ortho Device/Splint Interventions: Application   Post Interventions Patient Tolerated: Well, Ambulated well Instructions Provided: Adjustment of device, Care of device, Poper ambulation with device   Alvina ChouWilliams, Atharv Barriere C 08/16/2017, 2:19 PM

## 2017-08-19 ENCOUNTER — Ambulatory Visit: Payer: Worker's Compensation | Admitting: Anesthesiology

## 2017-08-19 ENCOUNTER — Encounter
Admission: RE | Disposition: A | Discharge status: Home / Self Care | Payer: Self-pay | Source: Ambulatory Visit | Attending: Orthopedic Surgery

## 2017-08-19 ENCOUNTER — Encounter: Payer: Self-pay | Admitting: Anesthesiology

## 2017-08-19 ENCOUNTER — Other Ambulatory Visit: Payer: Self-pay

## 2017-08-19 ENCOUNTER — Ambulatory Visit
Admission: RE | Admit: 2017-08-19 | Discharge: 2017-08-20 | Disposition: A | Discharge status: Home / Self Care | Payer: Worker's Compensation | Source: Ambulatory Visit | Attending: Orthopedic Surgery | Admitting: Orthopedic Surgery

## 2017-08-19 DIAGNOSIS — Y990 Civilian activity done for income or pay: Secondary | ICD-10-CM | POA: Insufficient documentation

## 2017-08-19 DIAGNOSIS — Y9389 Activity, other specified: Secondary | ICD-10-CM | POA: Diagnosis not present

## 2017-08-19 DIAGNOSIS — Z7982 Long term (current) use of aspirin: Secondary | ICD-10-CM | POA: Diagnosis not present

## 2017-08-19 DIAGNOSIS — W19XXXA Unspecified fall, initial encounter: Secondary | ICD-10-CM | POA: Diagnosis not present

## 2017-08-19 DIAGNOSIS — E039 Hypothyroidism, unspecified: Secondary | ICD-10-CM | POA: Insufficient documentation

## 2017-08-19 DIAGNOSIS — Y9289 Other specified places as the place of occurrence of the external cause: Secondary | ICD-10-CM | POA: Diagnosis not present

## 2017-08-19 DIAGNOSIS — S8262XA Displaced fracture of lateral malleolus of left fibula, initial encounter for closed fracture: Secondary | ICD-10-CM | POA: Insufficient documentation

## 2017-08-19 DIAGNOSIS — Z79890 Hormone replacement therapy (postmenopausal): Secondary | ICD-10-CM | POA: Insufficient documentation

## 2017-08-19 DIAGNOSIS — S82892A Other fracture of left lower leg, initial encounter for closed fracture: Secondary | ICD-10-CM | POA: Diagnosis present

## 2017-08-19 HISTORY — PX: ORIF ANKLE FRACTURE: SHX5408

## 2017-08-19 SURGERY — OPEN REDUCTION INTERNAL FIXATION (ORIF) ANKLE FRACTURE
Anesthesia: General | Site: Ankle | Laterality: Left | Wound class: Clean

## 2017-08-19 MED ORDER — ACETAMINOPHEN 10 MG/ML IV SOLN
1000.0000 mg | Freq: Four times a day (QID) | INTRAVENOUS | Status: DC
  Administered 2017-08-19 – 2017-08-20 (×3): 1000 mg via INTRAVENOUS
  Filled 2017-08-19 (×4): qty 100

## 2017-08-19 MED ORDER — LIDOCAINE HCL (CARDIAC) 20 MG/ML IV SOLN
INTRAVENOUS | Status: DC | PRN
  Administered 2017-08-19: 40 mg via INTRAVENOUS

## 2017-08-19 MED ORDER — NEOMYCIN-POLYMYXIN B GU 40-200000 IR SOLN
Status: DC | PRN
  Administered 2017-08-19: 2 mL

## 2017-08-19 MED ORDER — OXYCODONE HCL 5 MG/5ML PO SOLN: 5.0000 mg | Freq: Once | ORAL | Status: DC | PRN

## 2017-08-19 MED ORDER — OXYCODONE HCL 5 MG PO TABS: 5.0000 mg | ORAL_TABLET | Freq: Once | ORAL | Status: DC | PRN

## 2017-08-19 MED ORDER — FENTANYL CITRATE (PF) 100 MCG/2ML IJ SOLN
INTRAMUSCULAR | Status: DC | PRN
  Administered 2017-08-19 (×2): 25 ug via INTRAVENOUS
  Administered 2017-08-19: 50 ug via INTRAVENOUS
  Administered 2017-08-19: 25 ug via INTRAVENOUS
  Administered 2017-08-19: 50 ug via INTRAVENOUS
  Administered 2017-08-19: 25 ug via INTRAVENOUS

## 2017-08-19 MED ORDER — CEFAZOLIN SODIUM-DEXTROSE 2-4 GM/100ML-% IV SOLN
2.0000 g | INTRAVENOUS | Status: AC
  Administered 2017-08-19: 2 g via INTRAVENOUS

## 2017-08-19 MED ORDER — ONDANSETRON HCL 4 MG PO TABS: 4.0000 mg | ORAL_TABLET | Freq: Four times a day (QID) | ORAL | Status: DC | PRN

## 2017-08-19 MED ORDER — ONDANSETRON HCL 4 MG/2ML IJ SOLN: 4.0000 mg | Freq: Four times a day (QID) | INTRAMUSCULAR | Status: DC | PRN

## 2017-08-19 MED ORDER — FENTANYL CITRATE (PF) 100 MCG/2ML IJ SOLN
INTRAMUSCULAR | Status: AC
Start: 2017-08-19 — End: ?
  Filled 2017-08-19: qty 2

## 2017-08-19 MED ORDER — MAGNESIUM HYDROXIDE 400 MG/5ML PO SUSP: 30.0000 mL | Freq: Every day | ORAL | Status: DC | PRN

## 2017-08-19 MED ORDER — ACETAMINOPHEN 10 MG/ML IV SOLN
INTRAVENOUS | Status: AC
Start: 2017-08-19 — End: ?
  Filled 2017-08-19: qty 100

## 2017-08-19 MED ORDER — METOCLOPRAMIDE HCL 5 MG/ML IJ SOLN: 5.0000 mg | Freq: Three times a day (TID) | INTRAMUSCULAR | Status: DC | PRN

## 2017-08-19 MED ORDER — CHLORHEXIDINE GLUCONATE 4 % EX LIQD: 60.0000 mL | Freq: Once | CUTANEOUS | Status: DC

## 2017-08-19 MED ORDER — PHENYLEPHRINE HCL 10 MG/ML IJ SOLN
INTRAMUSCULAR | Status: DC | PRN
  Administered 2017-08-19 (×6): 100 ug via INTRAVENOUS

## 2017-08-19 MED ORDER — ONDANSETRON HCL 4 MG/2ML IJ SOLN
INTRAMUSCULAR | Status: AC
  Filled 2017-08-19: qty 2

## 2017-08-19 MED ORDER — MORPHINE SULFATE (PF) 2 MG/ML IV SOLN: 1.0000 mg | INTRAVENOUS | Status: DC | PRN

## 2017-08-19 MED ORDER — BISACODYL 10 MG RE SUPP: 10.0000 mg | Freq: Every day | RECTAL | Status: DC | PRN

## 2017-08-19 MED ORDER — SENNA 8.6 MG PO TABS
1.0000 | ORAL_TABLET | Freq: Two times a day (BID) | ORAL | Status: DC
  Administered 2017-08-19 – 2017-08-20 (×2): 8.6 mg via ORAL
  Filled 2017-08-19 (×3): qty 1

## 2017-08-19 MED ORDER — FLEET ENEMA 7-19 GM/118ML RE ENEM: 1.0000 | ENEMA | Freq: Once | RECTAL | Status: DC | PRN

## 2017-08-19 MED ORDER — SEVOFLURANE IN SOLN
RESPIRATORY_TRACT | Status: AC
  Filled 2017-08-19: qty 250

## 2017-08-19 MED ORDER — LACTATED RINGERS IV SOLN
Freq: Once | INTRAVENOUS | Status: AC
  Administered 2017-08-19: 13:00:00 via INTRAVENOUS

## 2017-08-19 MED ORDER — MIDAZOLAM HCL 2 MG/2ML IJ SOLN
INTRAMUSCULAR | Status: AC
  Filled 2017-08-19: qty 2

## 2017-08-19 MED ORDER — SODIUM CHLORIDE 0.9 % IV SOLN
INTRAVENOUS | Status: DC
  Administered 2017-08-19: 17:00:00 via INTRAVENOUS

## 2017-08-19 MED ORDER — PROPOFOL 10 MG/ML IV BOLUS
INTRAVENOUS | Status: AC
  Filled 2017-08-19: qty 20

## 2017-08-19 MED ORDER — FENTANYL CITRATE (PF) 100 MCG/2ML IJ SOLN: 25.0000 ug | INTRAMUSCULAR | Status: DC | PRN

## 2017-08-19 MED ORDER — ROCURONIUM BROMIDE 100 MG/10ML IV SOLN
INTRAVENOUS | Status: DC | PRN
  Administered 2017-08-19: 180 mg via INTRAVENOUS

## 2017-08-19 MED ORDER — METOCLOPRAMIDE HCL 10 MG PO TABS: 5.0000 mg | ORAL_TABLET | Freq: Three times a day (TID) | ORAL | Status: DC | PRN

## 2017-08-19 MED ORDER — OXYCODONE HCL 5 MG PO TABS
5.0000 mg | ORAL_TABLET | ORAL | Status: DC | PRN
  Administered 2017-08-19: 10 mg via ORAL
  Administered 2017-08-19: 5 mg via ORAL
  Administered 2017-08-20 (×3): 10 mg via ORAL
  Filled 2017-08-19 (×2): qty 2
  Filled 2017-08-19: qty 1
  Filled 2017-08-19 (×2): qty 2

## 2017-08-19 MED ORDER — MIDAZOLAM HCL 2 MG/2ML IJ SOLN
INTRAMUSCULAR | Status: DC | PRN
  Administered 2017-08-19: 2 mg via INTRAVENOUS

## 2017-08-19 MED ORDER — ONDANSETRON HCL 4 MG/2ML IJ SOLN
INTRAMUSCULAR | Status: DC | PRN
  Administered 2017-08-19: 4 mg via INTRAVENOUS

## 2017-08-19 MED ORDER — FENTANYL CITRATE (PF) 100 MCG/2ML IJ SOLN
INTRAMUSCULAR | Status: AC
  Filled 2017-08-19: qty 2

## 2017-08-19 MED ORDER — ACETAMINOPHEN 10 MG/ML IV SOLN
INTRAVENOUS | Status: DC | PRN
  Administered 2017-08-19: 1000 mg via INTRAVENOUS

## 2017-08-19 MED ORDER — BUPIVACAINE HCL 0.25 % IJ SOLN
INTRAMUSCULAR | Status: DC | PRN
  Administered 2017-08-19: 10 mL

## 2017-08-19 SURGICAL SUPPLY — 46 items
BANDAGE ACE 4X5 VEL STRL LF (GAUZE/BANDAGES/DRESSINGS) ×3 IMPLANT
BIT DRILL 2.5X110 QC LCP DISP (BIT) ×2 IMPLANT
BNDG COHESIVE 4X5 TAN STRL (GAUZE/BANDAGES/DRESSINGS) ×3 IMPLANT
BNDG ESMARK 6X12 TAN STRL LF (GAUZE/BANDAGES/DRESSINGS) ×2 IMPLANT
COOLER POLAR GLACIER W/PUMP (MISCELLANEOUS) ×2 IMPLANT
CUFF TOURN 24 STER (MISCELLANEOUS) IMPLANT
CUFF TOURN 30 STER DUAL PORT (MISCELLANEOUS) IMPLANT
DRAPE FLUOR MINI C-ARM 54X84 (DRAPES) ×2 IMPLANT
DRSG DERMACEA 8X12 NADH (GAUZE/BANDAGES/DRESSINGS) ×2 IMPLANT
DRSG TELFA 3X8 NADH (GAUZE/BANDAGES/DRESSINGS) ×2 IMPLANT
DURAPREP 26ML APPLICATOR (WOUND CARE) ×2 IMPLANT
ELECT CAUTERY BLADE 6.4 (BLADE) ×2 IMPLANT
ELECT REM PT RETURN 9FT ADLT (ELECTROSURGICAL) ×2
ELECTRODE REM PT RTRN 9FT ADLT (ELECTROSURGICAL) ×1 IMPLANT
GAUZE SPONGE 4X4 12PLY STRL (GAUZE/BANDAGES/DRESSINGS) ×2 IMPLANT
GLOVE BIOGEL M STRL SZ7.5 (GLOVE) ×2 IMPLANT
GLOVE INDICATOR 8.0 STRL GRN (GLOVE) ×2 IMPLANT
GOWN STRL REUS W/ TWL LRG LVL3 (GOWN DISPOSABLE) ×2 IMPLANT
GOWN STRL REUS W/TWL LRG LVL3 (GOWN DISPOSABLE) ×4
KIT TURNOVER KIT A (KITS) ×2 IMPLANT
LABEL OR SOLS (LABEL) ×2 IMPLANT
PACK EXTREMITY ARMC (MISCELLANEOUS) ×2 IMPLANT
PAD ABD DERMACEA PRESS 5X9 (GAUZE/BANDAGES/DRESSINGS) ×6 IMPLANT
PAD CAST CTTN 4X4 STRL (SOFTGOODS) ×3 IMPLANT
PAD DRESSING TELFA 3X8 NADH (GAUZE/BANDAGES/DRESSINGS) IMPLANT
PAD PREP 24X41 OB/GYN DISP (PERSONAL CARE ITEMS) ×2 IMPLANT
PAD WRAPON POLAR ANKLE (MISCELLANEOUS) ×1 IMPLANT
PADDING CAST COTTON 4X4 STRL (SOFTGOODS) ×6
PLATE LCP 3.5 1/3 TUB 10HX117 (Plate) ×1 IMPLANT
PLATE LCP 3.5 1/3 TUB 8HX93 (Plate) IMPLANT
SCREW CORTEX 3.5 12MM (Screw) ×3 IMPLANT
SCREW CORTEX 3.5 14MM (Screw) ×3 IMPLANT
SCREW CORTEX 3.5 16MM (Screw) ×2 IMPLANT
SCREW CORTEX 3.5 20MM (Screw) ×3 IMPLANT
SCREW LOCK CORT ST 3.5X12 (Screw) IMPLANT
SCREW LOCK CORT ST 3.5X14 (Screw) IMPLANT
SCREW LOCK CORT ST 3.5X16 (Screw) IMPLANT
SCREW LOCK CORT ST 3.5X20 (Screw) IMPLANT
SPLINT CAST 1 STEP 5X30 WHT (MISCELLANEOUS) ×2 IMPLANT
SPONGE LAP 18X18 5 PK (GAUZE/BANDAGES/DRESSINGS) ×2 IMPLANT
STAPLER SKIN PROX 35W (STAPLE) ×2 IMPLANT
STOCKINETTE STRL 6IN 960660 (GAUZE/BANDAGES/DRESSINGS) ×2 IMPLANT
SUT VIC AB 0 CT1 36 (SUTURE) ×4 IMPLANT
SUT VIC AB 2-0 SH 27 (SUTURE) ×2
SUT VIC AB 2-0 SH 27XBRD (SUTURE) ×1 IMPLANT
WRAPON POLAR PAD ANKLE (MISCELLANEOUS) ×2

## 2017-08-19 NOTE — Anesthesia Post-op Follow-up Note (Signed)
Anesthesia QCDR form completed.        

## 2017-08-19 NOTE — H&P (Signed)
The patient has been re-examined, and the chart reviewed, and there have been no interval changes to the documented history and physical.    The risks, benefits, and alternatives have been discussed at length. The patient expressed understanding of the risks benefits and agreed with plans for surgical intervention.  Nature Kueker P. Laquan Ludden, Jr. M.D.    

## 2017-08-19 NOTE — Anesthesia Procedure Notes (Signed)
Procedure Name: LMA Insertion Date/Time: 08/19/2017 12:35 PM Performed by: Henrietta HooverPope, Yahya Boldman, CRNA Pre-anesthesia Checklist: Patient identified, Emergency Drugs available, Suction available, Patient being monitored and Timeout performed Patient Re-evaluated:Patient Re-evaluated prior to induction Oxygen Delivery Method: Circle system utilized Preoxygenation: Pre-oxygenation with 100% oxygen Induction Type: IV induction Ventilation: Mask ventilation without difficulty LMA: LMA inserted LMA Size: 5.0 Number of attempts: 1 Placement Confirmation: ETT inserted through vocal cords under direct vision,  positive ETCO2 and breath sounds checked- equal and bilateral Tube secured with: Tape Dental Injury: Teeth and Oropharynx as per pre-operative assessment

## 2017-08-19 NOTE — Op Note (Signed)
OPERATIVE NOTE  DATE OF SURGERY:  08/19/2017  PATIENT NAME:  Peter Duncan   DOB: Jun 24, 1955  MRN: [id number]  PRE-OPERATIVE DIAGNOSIS: Left lateral malleolar ankle fracture (comminuted)  POST-OPERATIVE DIAGNOSIS:  Same  PROCEDURE:  Open reduction and internal fixation of the left lateral malleolar ankle fracture  SURGEON:  Jena GaussJames P Breean Nannini, Jr. M.D.  ANESTHESIA: general  ESTIMATED BLOOD LOSS: Minimal  FLUIDS REPLACED: 950 mL of crystalloid  TOURNIQUET TIME: 107 minutes  DRAINS: None  IMPLANTS UTILIZED: Synthes 10 hole 1/3 tubular plate, 9 - 3.5 mm cortical screws  INDICATIONS FOR SURGERY: Peter Duncan is a 62 y.o. year old male who sustained a left lateral malleolar ankle fracture. After discussion of the risks and benefits of surgical intervention, the patient expressed understanding of the risks benefits and agree with plans for open reduction and internal fixation.   PROCEDURE IN DETAIL: The patient was brought into the operating room and after adequate general anesthesia, a tourniquet was placed on the patient's left thigh.The left foot and ankle were prepped with alcohol and Duraprep and draped in the usual sterile fashion. A "time-out" was performed as per usual protocol. The foot and ankle were exsanguinated using an Esmarch and the tourniquet was inflated to 300 mmHg. A lateral longitudinal incision was made in line with the distal fibula. Dissection was carried down to the fracture site and the site was debrided of hematoma and soft tissue. A provisional reduction of the distal fibular fracture was performed and position visualized using the FluoroScan. A 10 hole one third tubular plate was contoured to fit along the lateral aspect of the distal fibula. The plate was then secured using a combination of nine 3.5 mm cortical screws, 2 of which were used as lag screws for the butterfly fragment. Good reduction and hardware placement was noted using the FluoroScan. The lateral  incision was then closed in layers using first #0 Vicryl followed by #2-0 Vicryl.  Good reduction with restoration of the ankle mortise and good position of hardware was noted in multiple planes using FluoroScan. Skin edges were then reapproximated using skin staples. Tourniquet was deflated after total tourniquet time of 107 minutes. A sterile dressing was applied followed by application of a posterior splint.   The patient tolerated the procedure well and was transported to the PACU in stable condition.  Peter Duncan P. Angie FavaHooten, Jr., M.D.

## 2017-08-19 NOTE — Anesthesia Postprocedure Evaluation (Signed)
Anesthesia Post Note  Patient: Peter Duncan  Procedure(s) Performed: OPEN REDUCTION INTERNAL FIXATION (ORIF) ANKLE FRACTURE (Left Ankle)  Patient location during evaluation: PACU Anesthesia Type: General Level of consciousness: awake and alert Pain management: pain level controlled Vital Signs Assessment: post-procedure vital signs reviewed and stable Respiratory status: spontaneous breathing, nonlabored ventilation, respiratory function stable and patient connected to nasal cannula oxygen Cardiovascular status: blood pressure returned to baseline and stable Postop Assessment: no apparent nausea or vomiting Anesthetic complications: no     Last Vitals:  Vitals:   08/19/17 1840 08/19/17 1946  BP: 120/75 (!) 124/96  Pulse: 83   Resp: 18 18  Temp: 36.6 C 37.1 C  SpO2: 98% 99%    Last Pain:  Vitals:   08/19/17 1946  TempSrc: Oral  PainSc:                  Yevette EdwardsJames G Adams

## 2017-08-19 NOTE — Discharge Instructions (Signed)
°  Instructions for Ankle Fracture ° ° Elveta Rape P. Vora Clover, Jr., M.D.    ° Dept. of Orthopaedics & Sports Medicine ° [facility] ° [address] ° [place], [place]  [address] ° ° Phone: [phone]   Fax: [phone] ° ° °ACTIVITY:  °• You may use crutches or a walker with NO weight-bearing to the affected leg ° °WOUND CARE:  °• Place one to two pillows under the left foot and ankle when sitting or lying.  °• Continue to use the ice packs or the Polar Care to reduce pain and swelling. °• Keep the left splint clean and dry. ° °MEDICATIONS: °• You may resume your regular medications. °• Please take the pain medication as prescribed. °• Do not take pain medication on an empty stomach. °• Do not drive or drink alcoholic beverages when taking pain medications. ° °CALL THE OFFICE FOR: °• Temperature above 101 degrees °• Excessive bleeding or drainage on the dressing. °• Excessive swelling, coldness, or paleness of the toes. °• Persistent nausea and vomiting. ° °FOLLOW-UP:  °• You should have an appointment to return to the office in 7-10 days.  °  °

## 2017-08-19 NOTE — Pre-Procedure Instructions (Signed)
Faxed request to Dr. Ernest PineHooten, requesting Dr.'s orders and H&P.

## 2017-08-19 NOTE — Anesthesia Preprocedure Evaluation (Signed)
Anesthesia Evaluation  Patient identified by MRN, date of birth, ID band Patient awake    Reviewed: Allergy & Precautions, H&P , NPO status , Patient's Chart, lab work & pertinent test results  History of Anesthesia Complications Negative for: history of anesthetic complications  Airway Mallampati: III  TM Distance: <3 FB Neck ROM: full    Dental  (+) Chipped   Pulmonary neg pulmonary ROS, neg shortness of breath,           Cardiovascular Exercise Tolerance: Good (-) angina(-) Past MI and (-) DOE negative cardio ROS       Neuro/Psych negative neurological ROS  negative psych ROS   GI/Hepatic negative GI ROS, Neg liver ROS, neg GERD  ,  Endo/Other  Hypothyroidism   Renal/GU      Musculoskeletal   Abdominal   Peds  Hematology negative hematology ROS (+)   Anesthesia Other Findings History reviewed. No pertinent past medical history.  Past Surgical History: No date: VASECTOMY  BMI    Body Mass Index:  26.22 kg/m      Reproductive/Obstetrics negative OB ROS                             Anesthesia Physical Anesthesia Plan  ASA: III  Anesthesia Plan: General LMA   Post-op Pain Management:    Induction: Intravenous  PONV Risk Score and Plan: 2 and Ondansetron, Dexamethasone and Midazolam  Airway Management Planned: LMA  Additional Equipment:   Intra-op Plan:   Post-operative Plan: Extubation in OR  Informed Consent: I have reviewed the patients History and Physical, chart, labs and discussed the procedure including the risks, benefits and alternatives for the proposed anesthesia with the patient or authorized representative who has indicated his/her understanding and acceptance.   Dental Advisory Given  Plan Discussed with: Anesthesiologist, CRNA and Surgeon  Anesthesia Plan Comments: (Patient consented preOp for postOp PNB PRN  Patient consented for risks of  anesthesia including but not limited to:  - adverse reactions to medications - damage to teeth, lips or other oral mucosa - sore throat or hoarseness - Damage to heart, brain, lungs or loss of life  Patient voiced understanding.)        Anesthesia Quick Evaluation

## 2017-08-19 NOTE — Progress Notes (Signed)
Pt arrived to room 147 from PACU. Pt alert but drowsy. IV infusing NS. Pt on room air. Polar care running. Family at bedside. Skin assessment completed with Reeves [place], RN. Sacral foam dressing applied. Pt oriented to unit and call bell system. Call bell and phone within reach. Pt denies pain at this time.

## 2017-08-19 NOTE — Transfer of Care (Signed)
Immediate Anesthesia Transfer of Care Note  Patient: Tonita CongGregory L Puccio  Procedure(s) Performed: OPEN REDUCTION INTERNAL FIXATION (ORIF) ANKLE FRACTURE (Left Ankle)  Patient Location: PACU  Anesthesia Type:General  Level of Consciousness: awake  Airway & Oxygen Therapy: Patient Spontanous Breathing and Patient connected to face mask oxygen  Post-op Assessment: Report given to RN and Post -op Vital signs reviewed and stable  Post vital signs: Reviewed and stable  Last Vitals:  Vitals:   08/19/17 1057 08/19/17 1548  BP: 121/90 118/83  Pulse: (!) 112 91  Resp: 17 12  Temp: 36.7 C (!) 36.3 C  SpO2: 100% 100%    Last Pain:  Vitals:   08/19/17 1057  TempSrc: Oral         Complications: No apparent anesthesia complications

## 2017-08-20 DIAGNOSIS — S8262XA Displaced fracture of lateral malleolus of left fibula, initial encounter for closed fracture: Secondary | ICD-10-CM | POA: Diagnosis not present

## 2017-08-20 MED ORDER — OXYCODONE HCL 5 MG PO TABS: 5.0000 mg | ORAL_TABLET | ORAL | 0 refills | Status: DC | PRN

## 2017-08-20 MED ORDER — ASPIRIN EC 81 MG PO TBEC
81.0000 mg | DELAYED_RELEASE_TABLET | Freq: Every day | ORAL | Status: DC
  Administered 2017-08-20: 81 mg via ORAL
  Filled 2017-08-20: qty 1

## 2017-08-20 NOTE — Evaluation (Signed)
Physical Therapy Evaluation Patient Details Name: Peter Duncan MRN: [id number] DOB: [birth year] Today's Date: 08/20/2017   History of Present Illness  62 y/o s/p L ankle fx with ORIF.  Clinical Impression  Pt is able to easily get to standing and ambulate with crutches and had no issues with stair negotiation and generally had no issues that would preclude going home safely.  Pt has crutches and shower chair, has access to knee scooter.  Pt is appropriate for d/c from a PT perspective.    Follow Up Recommendations Follow surgeon's recommendation for DC plan and follow-up therapies(likely will need PT once cleared for [facility])    Equipment Recommendations       Recommendations for Other Services       Precautions / Restrictions Precautions Precautions: Fall Restrictions LLE Weight Bearing: Non weight bearing      Mobility  Bed Mobility Overal bed mobility: Independent                Transfers Overall transfer level: Independent Equipment used: Crutches             General transfer comment: Pt able to rise and maintain balance w/o assist, confident use of the crutches  Ambulation/Gait Ambulation/Gait assistance: Modified independent (Device/Increase time) Ambulation Distance (Feet): 200 Feet Assistive device: Crutches       General Gait Details: Pt was safe and effective with crutches, no LOBs, no excessive fatigue with prolonged ambulation and ultimately safe  Stairs Stairs: Yes Stairs assistance: Modified independent (Device/Increase time) Stair Management: No rails;With crutches Number of Stairs: 4 General stair comments: Pt easily negotiates up/down steps safely w/o assist, reports he has been scooting up steps on his butt and plans to continue  Wheelchair Mobility    Modified Rankin (Stroke Patients Only)       Balance Overall balance assessment: Independent                                           Pertinent  Vitals/Pain Pain Assessment: 0-10 Pain Score: 7  Pain Location: L ankle, increased throbbing with gravity dependent position    Home Living Family/patient expects to be discharged to:: Private residence Living Arrangements: Spouse/significant other;Children Available Help at Discharge: Family;Available 24 hours/day   Home Access: Stairs to enter Entrance Stairs-Rails: None Entrance Stairs-Number of Steps: 1+1 Home Layout: Two level Home Equipment: Tub bench;Crutches      Prior Function Level of Independence: Independent         Comments: Pt very active, working, independent     Hand Dominance   Dominant Hand: Right    Extremity/Trunk Assessment   Upper Extremity Assessment Upper Extremity Assessment: Overall WFL for tasks assessed    Lower Extremity Assessment Lower Extremity Assessment: Overall WFL for tasks assessed(except L ankle not tested secondary to splint/WBing)       Communication   Communication: No difficulties  Cognition Arousal/Alertness: Awake/alert Behavior During Therapy: WFL for tasks assessed/performed Overall Cognitive Status: Within Functional Limits for tasks assessed                                        General Comments      Exercises     Assessment/Plan    PT Assessment All further PT needs can be met in the next  venue of care  PT Problem List Decreased strength;Decreased range of motion;Decreased activity tolerance;Decreased balance;Decreased mobility;Decreased coordination;Decreased knowledge of use of DME;Decreased safety awareness;Pain;Decreased knowledge of precautions       PT Treatment Interventions      PT Goals (Current goals can be found in the Care Plan section)  Acute Rehab PT Goals Patient Stated Goal: Go home PT Goal Formulation: With patient Time For Goal Achievement: 09/03/17 Potential to Achieve Goals: Good    Frequency     Barriers to discharge        Co-evaluation                AM-PAC PT "6 Clicks" Daily Activity  Outcome Measure Difficulty turning over in bed (including adjusting bedclothes, sheets and blankets)?: None Difficulty moving from lying on back to sitting on the side of the bed? : None Difficulty sitting down on and standing up from a chair with arms (e.g., wheelchair, bedside commode, etc,.)?: None Help needed moving to and from a bed to chair (including a wheelchair)?: None Help needed walking in hospital room?: None Help needed climbing 3-5 steps with a railing? : None 6 Click Score: 24    End of Session Equipment Utilized During Treatment: Gait belt Activity Tolerance: Patient tolerated treatment well Patient left: in bed;with call bell/phone within reach;with nursing/sitter in room Nurse Communication: Mobility status PT Visit Diagnosis: Pain;Other abnormalities of gait and mobility (R26.89) Pain - Right/Left: Left Pain - part of body: Ankle and joints of foot    Time: 5806-3868 PT Time Calculation (min) (ACUTE ONLY): 18 min   Charges:   PT Evaluation $PT Eval Low Complexity: 1 Low     PT G Codes:        Kreg Shropshire, DPT 08/20/2017, 12:11 PM

## 2017-08-20 NOTE — Progress Notes (Signed)
Discharge note;  Discharge instructions and prescriptions given to pt. IV removed. Pt dressed. Awaiting MD Hooten to come by and see pt. Then pt will be ready to discharge home with wife.

## 2017-08-20 NOTE — Discharge Summary (Signed)
Physician Discharge Summary  Patient ID: Peter Duncan MRN: [id number] DOB/AGE: 1956-03-15 62 y.o.  Admit date: 08/19/2017 Discharge date: 08/20/2017  Admission Diagnoses:  CLOSED MALLEOLAR FRACTURE OF LEFT ANKLE   Discharge Diagnoses: Patient Active Problem List   Diagnosis Date Noted  . Closed left ankle fracture 08/19/2017  . Witnessed episode of apnea 05/12/2016  . Abdominal hernia 05/06/2015    History reviewed. No pertinent past medical history.   Transfusion: No transfusions during this admission   Consultants (if any):   Discharged Condition: Improved  Hospital Course: Peter Duncan is an 62 y.o. male who was admitted 08/19/2017 with a diagnosis of comminuted left lateral malleolus fracture and went to the operating room on 08/19/2017 and underwent the above named procedures.    Surgeries:Procedure(s): OPEN REDUCTION INTERNAL FIXATION (ORIF) ANKLE FRACTURE on 08/19/2017  PRE-OPERATIVE DIAGNOSIS: Left lateral malleolar ankle fracture (comminuted)  POST-OPERATIVE DIAGNOSIS:  Same  PROCEDURE:  Open reduction and internal fixation of the left lateral malleolar ankle fracture  SURGEON:  [name], Jr. M.D.  ANESTHESIA: general  ESTIMATED BLOOD LOSS: Minimal  FLUIDS REPLACED: 950 mL of crystalloid  TOURNIQUET TIME: 107 minutes  DRAINS: None  IMPLANTS UTILIZED: Synthes 10 hole 1/3 tubular plate, 9 - 3.5 mm cortical screws  INDICATIONS FOR SURGERY: Peter Duncan is a 62 y.o. year old male who sustained a left lateral malleolar ankle fracture. After discussion of the risks and benefits of surgical intervention, the patient expressed understanding of the risks benefits and agree with plans for open reduction and internal fixation.   Patient tolerated the surgery well. No complications .Patient was taken to PACU where she was stabilized and then transferred to the orthopedic floor.  Patient started on 81 mg enteric-coated aspirin q 24 hrs. Foot  pumps applied bilaterally at 80 mm hgb. Heels elevated off bed with rolled towels. No evidence of DVT. Calves non tender. Negative Homan. Physical therapy started on day #1 for gait training and transfer with OT starting on  day #1 for ADL and assisted devices. Patient has done well with therapy.  Was ambulating with the assistance of crutches upon being discharged.  Patient's IV And Foley were discontinued on day #1 with Hemovac being discontinued on day #2. Dressing was changed on day 2 prior to patient being discharged   He was given perioperative antibiotics:  Anti-infectives (From admission, onward)   Start     Dose/Rate Route Frequency Ordered Stop   08/19/17 1100  ceFAZolin (ANCEF) IVPB 2g/100 mL premix     2 g 200 mL/hr over 30 Minutes Intravenous On call to O.R. 08/19/17 1046 08/19/17 1326    .  He was fitted with AV 1 compression foot pump devices, instructed on heel pumps, early ambulation, and fitted with TED stockings bilaterally for DVT prophylaxis.  He benefited maximally from the hospital stay and there were no complications.    Recent vital signs:  Vitals:   08/20/17 0442 08/20/17 0751  BP: 102/72 103/66  Pulse: 94 86  Resp: 19   Temp: 99.2 F (37.3 C) 98.2 F (36.8 C)  SpO2: 94% 95%    Recent laboratory studies:  Lab Results  Component Value Date   HGB 15.2 06/09/2017   HGB 14.3 05/12/2016   HGB 15.4 05/12/2015   Lab Results  Component Value Date   WBC 6.9 06/09/2017   PLT 239 06/09/2017   No results found for: INR Lab Results  Component Value Date   NA 139 06/09/2017  K 4.6 06/09/2017   CL 102 06/09/2017   CO2 31 06/09/2017   BUN 17 06/09/2017   CREATININE 1.03 06/09/2017   GLUCOSE 97 06/09/2017    Discharge Medications:   Allergies as of 08/20/2017   No Known Allergies     Medication List    STOP taking these medications   HYDROcodone-acetaminophen 5-325 MG tablet Commonly known as:  NORCO/VICODIN   ibuprofen 200 MG tablet Commonly  known as:  ADVIL,MOTRIN   montelukast 10 MG tablet Commonly known as:  SINGULAIR     TAKE these medications   aspirin EC 81 MG tablet Take 81 mg by mouth daily.   levothyroxine 25 MCG tablet Commonly known as:  SYNTHROID, LEVOTHROID TAKE ONE TABLET BY MOUTH ONCE DAILY BEFORE BREAKFAST   loratadine 10 MG tablet Commonly known as:  CLARITIN TAKE ONE TABLET BY MOUTH ONCE DAILY   multivitamin with minerals tablet Take 1 tablet by mouth daily.   oxyCODONE 5 MG immediate release tablet Commonly known as:  Oxy IR/ROXICODONE Take 1-2 tablets (5-10 mg total) by mouth every 3 (three) hours as needed for breakthrough pain.   PROBIOTIC DAILY PO Take 1 capsule by mouth daily.       Diagnostic Studies: Dg Tibia/fibula Left  Result Date: 08/16/2017 CLINICAL DATA:  Fracture, fell twisting LEFT ankle at work today EXAM: LEFT TIBIA AND FIBULA - 2 VIEW COMPARISON:  Preceding LEFT ankle radiographs FINDINGS: Ankle reported separately. Comminuted displaced distal LEFT fibular diaphyseal fracture. Proximal tibia and fibula appear intact. Knee alignment normal. No additional fracture dislocation seen. IMPRESSION: Trimalleolar fractures at the LEFT ankle as previously reported on ankle radiographs. No additional LEFT tibial or fibular abnormalities identified. Electronically Signed   By: Ulyses Southward M.D.   On: 08/16/2017 13:07   Dg Ankle Complete Left  Result Date: 08/16/2017 CLINICAL DATA:  Injury unloading a truck.  Initial encounter. EXAM: LEFT ANKLE COMPLETE - 3+ VIEW COMPARISON:  None. FINDINGS: Comminuted fracture of the distal fibular shaft with medial butterfly fragment that is displaced. There is a small posterior malleolus fracture without measurable articular surface component. Small acute fracture fragments at the medial malleolus without mortise widening on this nonstress view. Generalized soft tissue swelling.  Probable ankle joint effusion. IMPRESSION: 1. Comminuted distal fibular shaft  fracture. 2. Small fracture fragments at the medial and posterior malleolus. Electronically Signed   By: Marnee Spring M.D.   On: 08/16/2017 12:22   Dg Foot Complete Left  Result Date: 08/16/2017 CLINICAL DATA:  Turned LEFT ankle at work, boxes fell off a truck at work, swelling and deforming at lateral malleolus, initial encounter EXAM: LEFT FOOT - COMPLETE 3+ VIEW COMPARISON:  None FINDINGS: Osseous mineralization normal. Joint spaces preserved. Comminuted displaced fracture of the distal LEFT fibular diaphysis. Small avulsion fragments at tip of medial malleolus. Small posterior malleolar fracture fragment. No additional fracture or dislocation seen within LEFT foot. IMPRESSION: Trimalleolar fractures of the LEFT ankle. No additional LEFT foot fractures identified. Electronically Signed   By: Ulyses Southward M.D.   On: 08/16/2017 12:23    Disposition: 01-Home or Self Care  Discharge Instructions    Diet - low sodium heart healthy   Complete by:  As directed    Increase activity slowly   Complete by:  As directed       Follow-up Information    Tera Partridge, PA On 08/26/2017.   Specialty:  Physician Assistant Why:  at 2:30pm Contact information: [address] [facility]  [place] [place] [address] [phone]        Donato HeinzHooten, James P, MD On 09/29/2017.   Specialty:  Orthopedic Surgery Why:  at 2:00pm Contact information: [address] [facility] [place] [address] [phone]            Signed: Tera PartridgeWOLFE,Mckynna Vanloan R. 08/20/2017, 7:59 AM

## 2017-08-20 NOTE — Progress Notes (Signed)
   Subjective: 1 Day Post-Op Procedure(s) (LRB): OPEN REDUCTION INTERNAL FIXATION (ORIF) ANKLE FRACTURE (Left) Patient reports pain as 7 on 0-10 scale.   Patient is well, and has had no acute complaints or problems We will start therapy today.  Plan is to go Home after hospital stay. no nausea and no vomiting Patient denies any chest pains or shortness of breath. Had a lot of pain during the night.   Will need to watch narcotics.  May be starting to hallucinate a little bit with the oxycodone.  Objective: Vital signs in last 24 hours: Temp:  [97.3 F (36.3 C)-99.2 F (37.3 C)] 99.2 F (37.3 C) (03/09 0442) Pulse Rate:  [83-112] 94 (03/09 0442) Resp:  [12-20] 19 (03/09 0442) BP: (95-124)/(58-96) 102/72 (03/09 0442) SpO2:  [94 %-100 %] 94 % (03/09 0442) Weight:  [85.3 kg (188 lb)] 85.3 kg (188 lb) (03/08 1057) Left lower leg elevated on 2 pillows. Heels are non tender and elevated off the bed using rolled towels Polar Care in place and working.  Intake/Output from previous day: 03/08 0701 - 03/09 0700 In: 2685 [P.O.:660; I.V.:1625; IV Piggyback:400] Out: 685 [Urine:680; Blood:5] Intake/Output this shift: No intake/output data recorded.  No results for input(s): HGB in the last 72 hours. No results for input(s): WBC, RBC, HCT, PLT in the last 72 hours. No results for input(s): NA, K, CL, CO2, BUN, CREATININE, GLUCOSE, CALCIUM in the last 72 hours. No results for input(s): LABPT, INR in the last 72 hours.  EXAM General - Patient is Alert, Appropriate and Oriented Extremity - Neurologically intact Neurovascular intact Sensation intact distally Intact pulses distally Dorsiflexion/Plantar flexion intact Dressing - dressing C/D/I Motor Function - intact, moving foot and toes well on exam.    History reviewed. No pertinent past medical history.  Assessment/Plan: 1 Day Post-Op Procedure(s) (LRB): OPEN REDUCTION INTERNAL FIXATION (ORIF) ANKLE FRACTURE (Left) Active  Problems:   Closed left ankle fracture  Estimated body mass index is 26.22 kg/m as calculated from the following:   Height as of this encounter: 5\' 11"  (1.803 m).   Weight as of this encounter: 85.3 kg (188 lb). Advance diet Up with therapy D/C IV fluids Discharge home with home health after patient has physical therapy this morning  Labs: None DVT Prophylaxis - Aspirin Nonweightbearing left lower extremity Patient will follow-up in clinic in 1 week. Continue to elevate left lower extremity and use Polar Care around-the-clock. Over-the-counter Senokot-S for stool softeners.  Lynnda ShieldsJon R. [facility] CenterWolfe PA [facility] 08/20/2017, 7:51 AM

## 2017-08-21 ENCOUNTER — Encounter: Payer: Self-pay | Admitting: Orthopedic Surgery

## 2017-09-19 ENCOUNTER — Telehealth: Payer: Self-pay | Admitting: Family Medicine

## 2017-09-19 NOTE — Telephone Encounter (Signed)
Advised  To bring him in.

## 2017-09-19 NOTE — Telephone Encounter (Signed)
Patient thinks he is due for his 2nd shingles vaccine.  His wife Jacki Cones(Laurie)  wants to bring him in with her at her appt. Time on Wednesday.  Is it time for him to get this and can he come w/ Jacki ConesLaurie?

## 2017-09-21 ENCOUNTER — Ambulatory Visit (INDEPENDENT_AMBULATORY_CARE_PROVIDER_SITE_OTHER): Payer: BLUE CROSS/BLUE SHIELD | Admitting: Family Medicine

## 2017-09-21 DIAGNOSIS — Z23 Encounter for immunization: Secondary | ICD-10-CM

## 2017-09-21 NOTE — Progress Notes (Signed)
Pt here today for 2nd Shingrix vaccine. He is feeling well.

## 2017-10-20 ENCOUNTER — Telehealth: Payer: Self-pay | Admitting: Emergency Medicine

## 2017-10-20 DIAGNOSIS — J301 Allergic rhinitis due to pollen: Secondary | ICD-10-CM

## 2017-10-20 MED ORDER — LORATADINE-PSEUDOEPHEDRINE ER 10-240 MG PO TB24: 1.0000 | ORAL_TABLET | Freq: Every day | ORAL | 0 refills | Status: DC

## 2017-10-20 NOTE — Telephone Encounter (Signed)
Please review. Thanks!  

## 2017-10-20 NOTE — Telephone Encounter (Signed)
Sent in

## 2017-10-20 NOTE — Telephone Encounter (Signed)
Pt is requesting Claritin D be called in instead of regular Claritin because it works better. Please advise for Dr. Sullivan Lone. Thanks.   [employer].

## 2017-11-17 ENCOUNTER — Encounter
Admission: RE | Admit: 2017-11-17 | Discharge: 2017-11-17 | Disposition: A | Discharge status: Home / Self Care | Payer: BLUE CROSS/BLUE SHIELD | Source: Ambulatory Visit | Attending: Orthopedic Surgery | Admitting: Orthopedic Surgery

## 2017-11-17 ENCOUNTER — Other Ambulatory Visit: Payer: Self-pay

## 2017-11-17 HISTORY — DX: Hypothyroidism, unspecified: E03.9

## 2017-11-17 NOTE — Patient Instructions (Addendum)
Your procedure is scheduled on: 11-25-17  Report to Same Day Surgery 2nd floor medical mall [facility](Medical Mall Entrance-take elevator on left to 2nd floor.  Check in with surgery information desk.) To find out your arrival time please call [phone] between 1PM - 3PM on 11-24-17  Remember: Instructions that are not followed completely may result in serious medical risk, up to and including death, or upon the discretion of your surgeon and anesthesiologist your surgery may need to be rescheduled.    _x___ 1. Do not eat food after midnight the night before your procedure. NO GUM OR CANDY AFTER MIDNIGHT.  You may drink clear liquids up to 2 hours before you are scheduled to arrive at the hospital for your procedure.  Do not drink clear liquids within 2 hours of your scheduled arrival to the hospital.  Clear liquids include  --Water or Apple juice without pulp  --Clear carbohydrate beverage such as ClearFast or Gatorade  --Black Coffee or Clear Tea (No milk, no creamers, do not add anything to the coffee or Tea     __x__ 2. No Alcohol for 24 hours before or after surgery.   __x__3. No Smoking or e-cigarettes for 24 prior to surgery.  Do not use any chewable tobacco products for at least 6 hour prior to surgery   ____  4. Bring all medications with you on the day of surgery if instructed.    __x__ 5. Notify your doctor if there is any change in your medical condition     (cold, fever, infections).    x___6. On the morning of surgery brush your teeth with toothpaste and water.  You may rinse your mouth with mouth wash if you wish.  Do not swallow any toothpaste or mouthwash.   Do not wear jewelry, make-up, hairpins, clips or nail polish.  Do not wear lotions, powders, or perfumes. You may wear deodorant.  Do not shave 48 hours prior to surgery. Men may shave face and neck.  Do not bring valuables to the hospital.    [facility] is not [facility] for any belongings or valuables.     Contacts, dentures or bridgework may not be worn into surgery.  Leave your suitcase in the car. After surgery it may be brought to your room.  For patients admitted to the hospital, discharge time is determined by your treatment team.  _  Patients discharged the day of surgery will not be allowed to drive home.  You will need someone to drive you home and stay with you the night of your procedure.     _x___ TAKE THE FOLLOWING MEDICATION THE MORNING OF SURGERY WITH A SMALL SIP OF WATER. These include:  1. LEVOTHYROXINE  2.  3.  4.  5.  6.  ____Fleets enema or Magnesium Citrate as directed.   ____ Use CHG Soap or sage wipes as directed on instruction sheet   ____ Use inhalers on the day of surgery and bring to hospital day of surgery  ____ Stop Metformin and Janumet 2 days prior to surgery.    ____ Take 1/2 of usual insulin dose the night before surgery and none on the morning surgery.   ____ Follow recommendations from Cardiologist, Pulmonologist or PCP regarding stopping Aspirin, Coumadin, Plavix ,Eliquis, Effient, or Pradaxa, and Pletal.  X____Stop Anti-inflammatories such as Advil, Aleve, Ibuprofen, Motrin, Naproxen, Naprosyn, Goodies powders or aspirin products NOW-OK to take Tylenol    ____ Stop supplements until after surgery.    ____ Hilton HotelsBring  C-Pap to the hospital.

## 2017-11-20 ENCOUNTER — Other Ambulatory Visit: Payer: Self-pay | Admitting: Family Medicine

## 2017-11-25 ENCOUNTER — Encounter: Payer: Self-pay | Admitting: Orthopedic Surgery

## 2017-11-25 ENCOUNTER — Ambulatory Visit: Payer: Worker's Compensation | Admitting: Anesthesiology

## 2017-11-25 ENCOUNTER — Encounter
Admission: RE | Disposition: A | Discharge status: Home / Self Care | Payer: Self-pay | Source: Ambulatory Visit | Attending: Orthopedic Surgery

## 2017-11-25 ENCOUNTER — Ambulatory Visit
Admission: RE | Admit: 2017-11-25 | Discharge: 2017-11-25 | Disposition: A | Discharge status: Home / Self Care | Payer: Worker's Compensation | Source: Ambulatory Visit | Attending: Orthopedic Surgery | Admitting: Orthopedic Surgery

## 2017-11-25 DIAGNOSIS — X58XXXA Exposure to other specified factors, initial encounter: Secondary | ICD-10-CM | POA: Insufficient documentation

## 2017-11-25 DIAGNOSIS — Z79890 Hormone replacement therapy (postmenopausal): Secondary | ICD-10-CM | POA: Insufficient documentation

## 2017-11-25 DIAGNOSIS — M23222 Derangement of posterior horn of medial meniscus due to old tear or injury, left knee: Secondary | ICD-10-CM | POA: Diagnosis not present

## 2017-11-25 DIAGNOSIS — Z8781 Personal history of (healed) traumatic fracture: Secondary | ICD-10-CM | POA: Diagnosis not present

## 2017-11-25 DIAGNOSIS — M2392 Unspecified internal derangement of left knee: Secondary | ICD-10-CM | POA: Diagnosis not present

## 2017-11-25 DIAGNOSIS — Z791 Long term (current) use of non-steroidal anti-inflammatories (NSAID): Secondary | ICD-10-CM | POA: Insufficient documentation

## 2017-11-25 DIAGNOSIS — Z9889 Other specified postprocedural states: Secondary | ICD-10-CM | POA: Insufficient documentation

## 2017-11-25 DIAGNOSIS — M94262 Chondromalacia, left knee: Secondary | ICD-10-CM | POA: Diagnosis not present

## 2017-11-25 DIAGNOSIS — E039 Hypothyroidism, unspecified: Secondary | ICD-10-CM | POA: Insufficient documentation

## 2017-11-25 DIAGNOSIS — S83232A Complex tear of medial meniscus, current injury, left knee, initial encounter: Secondary | ICD-10-CM | POA: Insufficient documentation

## 2017-11-25 DIAGNOSIS — Z79899 Other long term (current) drug therapy: Secondary | ICD-10-CM | POA: Diagnosis not present

## 2017-11-25 HISTORY — PX: KNEE ARTHROSCOPY: SHX127

## 2017-11-25 SURGERY — ARTHROSCOPY, KNEE
Anesthesia: General | Site: Knee | Laterality: Left | Wound class: Clean

## 2017-11-25 MED ORDER — PHENYLEPHRINE HCL 10 MG/ML IJ SOLN
INTRAMUSCULAR | Status: DC | PRN
  Administered 2017-11-25 (×6): 100 ug via INTRAVENOUS

## 2017-11-25 MED ORDER — LACTATED RINGERS IV SOLN
INTRAVENOUS | Status: DC
  Administered 2017-11-25: 07:00:00 via INTRAVENOUS

## 2017-11-25 MED ORDER — METOCLOPRAMIDE HCL 5 MG/ML IJ SOLN: 5.0000 mg | Freq: Three times a day (TID) | INTRAMUSCULAR | Status: DC | PRN

## 2017-11-25 MED ORDER — ONDANSETRON HCL 4 MG/2ML IJ SOLN
INTRAMUSCULAR | Status: DC | PRN
  Administered 2017-11-25: 4 mg via INTRAVENOUS

## 2017-11-25 MED ORDER — ACETAMINOPHEN 10 MG/ML IV SOLN
INTRAVENOUS | Status: DC | PRN
  Administered 2017-11-25: 1000 mg via INTRAVENOUS

## 2017-11-25 MED ORDER — HYDROCODONE-ACETAMINOPHEN 5-325 MG PO TABS: 1.0000 | ORAL_TABLET | ORAL | 0 refills | Status: DC | PRN

## 2017-11-25 MED ORDER — EPHEDRINE SULFATE 50 MG/ML IJ SOLN
INTRAMUSCULAR | Status: DC | PRN
  Administered 2017-11-25: 10 mg via INTRAVENOUS

## 2017-11-25 MED ORDER — PROPOFOL 10 MG/ML IV BOLUS
INTRAVENOUS | Status: AC
  Filled 2017-11-25: qty 20

## 2017-11-25 MED ORDER — LIDOCAINE HCL (CARDIAC) PF 100 MG/5ML IV SOSY
PREFILLED_SYRINGE | INTRAVENOUS | Status: DC | PRN
  Administered 2017-11-25: 100 mg via INTRAVENOUS

## 2017-11-25 MED ORDER — MIDAZOLAM HCL 2 MG/2ML IJ SOLN
INTRAMUSCULAR | Status: AC
  Filled 2017-11-25: qty 2

## 2017-11-25 MED ORDER — MORPHINE SULFATE (PF) 4 MG/ML IV SOLN
INTRAVENOUS | Status: AC
  Filled 2017-11-25: qty 1

## 2017-11-25 MED ORDER — FENTANYL CITRATE (PF) 100 MCG/2ML IJ SOLN
INTRAMUSCULAR | Status: DC | PRN
  Administered 2017-11-25 (×3): 50 ug via INTRAVENOUS

## 2017-11-25 MED ORDER — MIDAZOLAM HCL 2 MG/2ML IJ SOLN
INTRAMUSCULAR | Status: DC | PRN
  Administered 2017-11-25: 2 mg via INTRAVENOUS

## 2017-11-25 MED ORDER — ONDANSETRON HCL 4 MG PO TABS: 4.0000 mg | ORAL_TABLET | Freq: Four times a day (QID) | ORAL | Status: DC | PRN

## 2017-11-25 MED ORDER — MORPHINE SULFATE 4 MG/ML IJ SOLN
INTRAMUSCULAR | Status: DC | PRN
  Administered 2017-11-25: 4 mg

## 2017-11-25 MED ORDER — BUPIVACAINE-EPINEPHRINE 0.25% -1:200000 IJ SOLN
INTRAMUSCULAR | Status: DC | PRN
  Administered 2017-11-25: 30 mL

## 2017-11-25 MED ORDER — FENTANYL CITRATE (PF) 100 MCG/2ML IJ SOLN: 25.0000 ug | INTRAMUSCULAR | Status: DC | PRN

## 2017-11-25 MED ORDER — ONDANSETRON HCL 4 MG/2ML IJ SOLN: 4.0000 mg | Freq: Four times a day (QID) | INTRAMUSCULAR | Status: DC | PRN

## 2017-11-25 MED ORDER — CHLORHEXIDINE GLUCONATE 4 % EX LIQD: 60.0000 mL | Freq: Once | CUTANEOUS | Status: DC

## 2017-11-25 MED ORDER — CELECOXIB 200 MG PO CAPS
ORAL_CAPSULE | ORAL | Status: AC
  Administered 2017-11-25: 400 mg via ORAL
  Filled 2017-11-25: qty 2

## 2017-11-25 MED ORDER — SODIUM CHLORIDE 0.9 % IV SOLN: INTRAVENOUS | Status: DC

## 2017-11-25 MED ORDER — FAMOTIDINE 20 MG PO TABS
20.0000 mg | ORAL_TABLET | Freq: Once | ORAL | Status: AC
  Administered 2017-11-25: 20 mg via ORAL

## 2017-11-25 MED ORDER — BUPIVACAINE-EPINEPHRINE (PF) 0.25% -1:200000 IJ SOLN
INTRAMUSCULAR | Status: AC
  Filled 2017-11-25: qty 30

## 2017-11-25 MED ORDER — METOCLOPRAMIDE HCL 10 MG PO TABS: 5.0000 mg | ORAL_TABLET | Freq: Three times a day (TID) | ORAL | Status: DC | PRN

## 2017-11-25 MED ORDER — CELECOXIB 200 MG PO CAPS
400.0000 mg | ORAL_CAPSULE | Freq: Once | ORAL | Status: AC
  Administered 2017-11-25: 400 mg via ORAL

## 2017-11-25 MED ORDER — HYDROCODONE-ACETAMINOPHEN 5-325 MG PO TABS: 1.0000 | ORAL_TABLET | ORAL | Status: DC | PRN

## 2017-11-25 MED ORDER — PROMETHAZINE HCL 25 MG/ML IJ SOLN: 6.2500 mg | INTRAMUSCULAR | Status: DC | PRN

## 2017-11-25 MED ORDER — FENTANYL CITRATE (PF) 100 MCG/2ML IJ SOLN
INTRAMUSCULAR | Status: AC
  Filled 2017-11-25: qty 2

## 2017-11-25 MED ORDER — DEXAMETHASONE SODIUM PHOSPHATE 10 MG/ML IJ SOLN
INTRAMUSCULAR | Status: DC | PRN
  Administered 2017-11-25: 10 mg via INTRAVENOUS

## 2017-11-25 MED ORDER — FAMOTIDINE 20 MG PO TABS
ORAL_TABLET | ORAL | Status: AC
  Administered 2017-11-25: 20 mg via ORAL
  Filled 2017-11-25: qty 1

## 2017-11-25 MED ORDER — PROPOFOL 10 MG/ML IV BOLUS
INTRAVENOUS | Status: DC | PRN
  Administered 2017-11-25: 200 mg via INTRAVENOUS

## 2017-11-25 SURGICAL SUPPLY — 22 items
BLADE SHAVER 4.5 DBL SERAT CV (CUTTER) IMPLANT
CUFF TOURN 24 STER (MISCELLANEOUS) IMPLANT
CUFF TOURN 30 STER DUAL PORT (MISCELLANEOUS) IMPLANT
DRSG DERMACEA 8X12 NADH (GAUZE/BANDAGES/DRESSINGS) ×2 IMPLANT
DURAPREP 26ML APPLICATOR (WOUND CARE) ×4 IMPLANT
GAUZE SPONGE 4X4 12PLY STRL (GAUZE/BANDAGES/DRESSINGS) ×2 IMPLANT
GLOVE BIOGEL M STRL SZ7.5 (GLOVE) ×2 IMPLANT
GLOVE INDICATOR 8.0 STRL GRN (GLOVE) ×2 IMPLANT
GOWN STRL REUS W/ TWL LRG LVL3 (GOWN DISPOSABLE) ×2 IMPLANT
GOWN STRL REUS W/TWL LRG LVL3 (GOWN DISPOSABLE) ×4
IV LACTATED RINGER IRRG 3000ML (IV SOLUTION) ×12
IV LR IRRIG 3000ML ARTHROMATIC (IV SOLUTION) ×6 IMPLANT
KIT TURNOVER KIT A (KITS) ×2 IMPLANT
MANIFOLD NEPTUNE II (INSTRUMENTS) ×2 IMPLANT
PACK ARTHROSCOPY KNEE (MISCELLANEOUS) ×2 IMPLANT
SET TUBE SUCT SHAVER OUTFL 24K (TUBING) ×2 IMPLANT
SET TUBE TIP INTRA-ARTICULAR (MISCELLANEOUS) ×2 IMPLANT
SUT ETHILON 3-0 FS-10 30 BLK (SUTURE) ×2
SUTURE EHLN 3-0 FS-10 30 BLK (SUTURE) ×1 IMPLANT
TUBING ARTHRO INFLOW-ONLY STRL (TUBING) ×2 IMPLANT
WAND HAND CNTRL MULTIVAC 50 (MISCELLANEOUS) ×2 IMPLANT
WRAP KNEE W/COLD PACKS 25.5X14 (SOFTGOODS) ×2 IMPLANT

## 2017-11-25 NOTE — Anesthesia Procedure Notes (Signed)
Procedure Name: LMA Insertion Date/Time: 11/25/2017 9:07 AM Performed by: Junious SilkNoles, Ceazia Harb, CRNA Pre-anesthesia Checklist: Patient identified, Patient being monitored, Timeout performed, Emergency Drugs available and Suction available Patient Re-evaluated:Patient Re-evaluated prior to induction Oxygen Delivery Method: Circle system utilized Preoxygenation: Pre-oxygenation with 100% oxygen Induction Type: IV induction Ventilation: Mask ventilation without difficulty LMA: LMA inserted LMA Size: 4.0 Tube type: Oral Number of attempts: 1 Placement Confirmation: positive ETCO2 and breath sounds checked- equal and bilateral Tube secured with: Tape Dental Injury: Teeth and Oropharynx as per pre-operative assessment

## 2017-11-25 NOTE — Discharge Instructions (Addendum)
°  Instructions after Knee Arthroscopy  ° °- James P. Hooten, Jr., M.D.    ° Dept. of Orthopaedics & Sports Medicine ° [facility] ° [address] ° [place], [place]  [address] ° ° Phone: [phone]   Fax: [phone] ° ° °DIET: °• Drink plenty of non-alcoholic fluids & begin a light diet. °• Resume your normal diet the day after surgery. ° °ACTIVITY:  °• You may use crutches or a walker with weight-bearing as tolerated, unless instructed otherwise. °• You may wean yourself off of the walker or crutches as tolerated.  °• Begin doing gentle exercises. Exercising will reduce the pain and swelling, increase motion, and prevent muscle weakness.   °• Avoid strenuous activities or athletics for a minimum of 4-6 weeks after arthroscopic surgery. °• Do not drive or operate any equipment until instructed. ° °WOUND CARE:  °• Place one to two pillows under the knee the first day or two when sitting or lying.  °• Continue to use the ice packs periodically to reduce pain and swelling. °• The small incisions in your knee are closed with nylon stitches. The stitches will be removed in the office. °• The bulky dressing may be removed on the second day after surgery. DO NOT TOUCH THE STITCHES. Put a Band-Aid over each stitch. Do NOT use any ointments or creams on the incisions.  °• You may bathe or shower after the stitches are removed at the first office visit following surgery. ° °MEDICATIONS: °• You may resume your regular medications. °• Please take the pain medication as prescribed. °• Do not take pain medication on an empty stomach. °• Do not drive or drink alcoholic beverages when taking pain medications. ° °CALL THE OFFICE FOR: °• Temperature above 101 degrees °• Excessive bleeding or drainage on the dressing. °• Excessive swelling, coldness, or paleness of the toes. °• Persistent nausea and vomiting. ° °FOLLOW-UP:  °• You should have an appointment to return to the office in 7-10 days after surgery.   ° ° °AMBULATORY SURGERY  °DISCHARGE INSTRUCTIONS ° ° °1) The drugs that you were given will stay in your system until tomorrow so for the next 24 hours you should not: ° °A) Drive an automobile °B) Make any legal decisions °C) Drink any alcoholic beverage ° ° °2) You may resume regular meals tomorrow.  Today it is better to start with liquids and gradually work up to solid foods. ° °You may eat anything you prefer, but it is better to start with liquids, then soup and crackers, and gradually work up to solid foods. ° ° °3) Please notify your doctor immediately if you have any unusual bleeding, trouble breathing, redness and pain at the surgery site, drainage, fever, or pain not relieved by medication. ° ° ° °4) Additional Instructions: ° ° ° ° ° ° ° °Please contact your physician with any problems or Same Day Surgery at [phone], Monday through Friday 6 am to 4 pm, or [place] [facility] [place] Main number at [phone]. ° °

## 2017-11-25 NOTE — Transfer of Care (Signed)
Immediate Anesthesia Transfer of Care Note  Patient: Peter Duncan  Procedure(s) Performed: ARTHROSCOPY KNEE, PARTIAL MEDIAL MENISECTOMY (Left Knee)  Patient Location: PACU  Anesthesia Type:General  Level of Consciousness: sedated  Airway & Oxygen Therapy: Patient Spontanous Breathing and Patient connected to face mask oxygen  Post-op Assessment: Report given to RN and Post -op Vital signs reviewed and stable  Post vital signs: Reviewed and stable  Last Vitals:  Vitals Value Taken Time  BP    Temp    Pulse    Resp    SpO2      Last Pain:  Vitals:   11/25/17 0729  TempSrc: Oral  PainSc: 0-No pain         Complications: No apparent anesthesia complications

## 2017-11-25 NOTE — Anesthesia Postprocedure Evaluation (Signed)
Anesthesia Post Note  Patient: Peter Duncan  Procedure(s) Performed: ARTHROSCOPY KNEE, PARTIAL MEDIAL MENISECTOMY (Left Knee)  Patient location during evaluation: PACU Anesthesia Type: General Level of consciousness: awake and alert Pain management: pain level controlled Vital Signs Assessment: post-procedure vital signs reviewed and stable Respiratory status: spontaneous breathing, nonlabored ventilation, respiratory function stable and patient connected to nasal cannula oxygen Cardiovascular status: blood pressure returned to baseline and stable Postop Assessment: no apparent nausea or vomiting Anesthetic complications: no     Last Vitals:  Vitals:   11/25/17 1043 11/25/17 1115  BP: 129/72 124/78  Pulse: 88 89  Resp: 16 16  Temp: (!) 36.1 C (!) 36.4 C  SpO2: 96% 96%    Last Pain:  Vitals:   11/25/17 1115  TempSrc: Temporal  PainSc: 0-No pain                 Lenard SimmerAndrew Terilynn Buresh

## 2017-11-25 NOTE — Op Note (Signed)
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   ASSISTANT: none  ANESTHESIA: general  ESTIMATED BLOOD LOSS: Minimal  FLUIDS REPLACED: 900 mL of crystalloid  TOURNIQUET TIME: Not used  DRAINS: none  IMPLANTS UTILIZED: None  INDICATIONS FOR SURGERY: Peter Duncan is a 62 y.o. year old male who has been seen for complaints of left knee pain. MRI demonstrated findings consistent with meniscal pathology. After discussion of the risks and benefits of surgical intervention, the patient expressed understanding of the risks benefits and agree with plans for left knee arthroscopy.   PROCEDURE IN DETAIL: The patient was brought into the operating room and, after adequate general anesthesia was achieved, a tourniquet was applied to the left thigh and the leg was placed in the leg holder. All bony prominences were well padded. The patient's left knee was cleaned and prepped with alcohol and Duraprep and draped in the usual sterile fashion. A "timeout" was performed as per usual protocol. The anticipated portal sites were injected with 0.25% Marcaine with epinephrine. An anterolateral incision was made and a cannula was inserted. A small effusion was evacuated and the knee was distended with fluid using the pump. The scope was advanced down the medial gutter into the medial compartment. Under visualization with the scope, an anteromedial portal was created and a hooked probe was inserted. The medial meniscus was visualized and probed.  There was a complex tear of the posterior horn of the  medial meniscus.  The tear was debrided using meniscal punches and a 4.5 mm incisor shaver.  Final contour was performed using the 50 degree ArthroCare wand.  The articular cartilage was visualized.  There were grade 2-3 changes of chondromalacia involving localized areas of the medial femoral condyle and the medial tibial plateau.  These areas were debrided and contoured using the ArthroCare wand.  The scope was then advanced into the intercondylar notch. The anterior cruciate ligament was visualized and probed and felt to be intact. The scope was removed from the lateral portal and reinserted via the anteromedial portal to better visualize the lateral compartment. The lateral meniscus was visualized and probed.  The lateral meniscus was intact.  The articular cartilage of the lateral compartment was visualized and noted to be in excellent condition.  Finally, the scope was advanced so as to visualize the patellofemoral articulation. Good patellar tracking was appreciated.  The articular surface was in good condition.  The knee was irrigated with copius amounts of fluid and suctioned dry. The anterolateral portal was re-approximated with #3-0 nylon. A combination of 0.25% Marcaine with epinephrine and 4 mg of Morphine were injected via the scope. The scope was removed and the anteromedial portal was re-approximated with #3-0 nylon. A sterile dressing was applied followed by application of an ice wrap.  The patient tolerated the procedure well and was transported to the PACU in stable condition.  James P. Angie FavaHooten, Jr., M.D.

## 2017-11-25 NOTE — Anesthesia Post-op Follow-up Note (Signed)
Anesthesia QCDR form completed.        

## 2017-11-25 NOTE — Anesthesia Preprocedure Evaluation (Signed)
Anesthesia Evaluation  Patient identified by MRN, date of birth, ID band Patient awake    Reviewed: Allergy & Precautions, H&P , NPO status , Patient's Chart, lab work & pertinent test results  History of Anesthesia Complications Negative for: history of anesthetic complications  Airway Mallampati: III  TM Distance: <3 FB Neck ROM: full    Dental  (+) Chipped   Pulmonary neg pulmonary ROS, neg shortness of breath,           Cardiovascular Exercise Tolerance: Good (-) angina(-) Past MI and (-) DOE negative cardio ROS       Neuro/Psych negative neurological ROS  negative psych ROS   GI/Hepatic negative GI ROS, Neg liver ROS, neg GERD  ,  Endo/Other  Hypothyroidism   Renal/GU      Musculoskeletal   Abdominal   Peds  Hematology negative hematology ROS (+)   Anesthesia Other Findings History reviewed. No pertinent past medical history.  Past Surgical History: No date: VASECTOMY  BMI    Body Mass Index:  26.22 kg/m      Reproductive/Obstetrics negative OB ROS                             Anesthesia Physical  Anesthesia Plan  ASA: III  Anesthesia Plan: General LMA   Post-op Pain Management:    Induction: Intravenous  PONV Risk Score and Plan: 2 and Ondansetron, Dexamethasone and Midazolam  Airway Management Planned: LMA  Additional Equipment:   Intra-op Plan:   Post-operative Plan: Extubation in OR  Informed Consent: I have reviewed the patients History and Physical, chart, labs and discussed the procedure including the risks, benefits and alternatives for the proposed anesthesia with the patient or authorized representative who has indicated his/her understanding and acceptance.   Dental Advisory Given  Plan Discussed with: Anesthesiologist, CRNA and Surgeon  Anesthesia Plan Comments: (Patient consented preOp for postOp PNB PRN  Patient consented for risks of  anesthesia including but not limited to:  - adverse reactions to medications - damage to teeth, lips or other oral mucosa - sore throat or hoarseness - Damage to heart, brain, lungs or loss of life  Patient voiced understanding.)        Anesthesia Quick Evaluation

## 2017-11-25 NOTE — H&P (Signed)
The patient has been re-examined, and the chart reviewed, and there have been no interval changes to the documented history and physical.    The risks, benefits, and alternatives have been discussed at length. The patient expressed understanding of the risks benefits and agreed with plans for surgical intervention.  Quantae Martel P. Holland Nickson, Jr. M.D.    

## 2017-11-26 ENCOUNTER — Encounter: Payer: Self-pay | Admitting: Orthopedic Surgery

## 2018-02-28 ENCOUNTER — Other Ambulatory Visit: Payer: Self-pay | Admitting: Podiatry

## 2018-03-01 DIAGNOSIS — Z23 Encounter for immunization: Secondary | ICD-10-CM | POA: Diagnosis not present

## 2018-03-02 ENCOUNTER — Other Ambulatory Visit: Payer: Self-pay

## 2018-03-02 ENCOUNTER — Encounter: Payer: Self-pay | Admitting: *Deleted

## 2018-03-02 NOTE — Discharge Instructions (Signed)
[facility] °[facility] ° °POST OPERATIVE INSTRUCTIONS FOR DR. TROXLER AND DR. FOWLER °[facility] ° ° °1. Take your medication as prescribed.  Pain medication should be taken only as needed. ° °2. Keep the dressing clean, dry and intact. ° °3. Keep your foot elevated above the heart level for the first 48 hours. ° °4. Walking to the bathroom and brief periods of walking are acceptable, unless we have instructed you to be non-weight bearing. ° °5. Always wear your post-op shoe when walking.  Always use your crutches if you are to be non-weight bearing. ° °6. Do not take a shower. Baths are permissible as long as the foot is kept out of the water.  ° °7. Every hour you are awake:  °- Bend your knee 15 times. °- Flex foot 15 times °- Massage calf 15 times ° °8. Call [facility] ([phone]) if any of the following problems occur: °- You develop a temperature or fever. °- The bandage becomes saturated with blood. °- Medication does not stop your pain. °- Injury of the foot occurs. °- Any symptoms of infection including redness, odor, or red streaks running from wound. ° ° °General Anesthesia, Adult, Care After °These instructions provide you with information about caring for yourself after your procedure. Your health care provider may also give you more specific instructions. Your treatment has been planned according to current medical practices, but problems sometimes occur. Call your health care provider if you have any problems or questions after your procedure. °What can I expect after the procedure? °After the procedure, it is common to have: °· Vomiting. °· A sore throat. °· Mental slowness. ° °It is common to feel: °· Nauseous. °· Cold or shivery. °· Sleepy. °· Tired. °· Sore or achy, even in parts of your body where you did not have surgery. ° °Follow these instructions at home: °For at least 24 hours after the procedure: °· Do not: °? Participate in  activities where you could fall or become injured. °? Drive. °? Use heavy machinery. °? Drink alcohol. °? Take sleeping pills or medicines that cause drowsiness. °? Make important decisions or sign legal documents. °? Take care of children on your own. °· Rest. °Eating and drinking °· If you vomit, drink water, juice, or soup when you can drink without vomiting. °· Drink enough fluid to keep your urine clear or pale yellow. °· Make sure you have little or no nausea before eating solid foods. °· Follow the diet recommended by your health care provider. °General instructions °· Have a responsible adult stay with you until you are awake and alert. °· Return to your normal activities as told by your health care provider. Ask your health care provider what activities are safe for you. °· Take over-the-counter and prescription medicines only as told by your health care provider. °· If you smoke, do not smoke without supervision. °· Keep all follow-up visits as told by your health care provider. This is important. °Contact a health care provider if: °· You continue to have nausea or vomiting at home, and medicines are not helpful. °· You cannot drink fluids or start eating again. °· You cannot urinate after 8-12 hours. °· You develop a skin rash. °· You have fever. °· You have increasing redness at the site of your procedure. °Get help right away if: °· You have difficulty breathing. °· You have chest pain. °· You have unexpected bleeding. °· You feel that you   are having a life-threatening or urgent problem. °This information is not intended to replace advice given to you by your health care provider. Make sure you discuss any questions you have with your health care provider. °Document Released: 09/06/2000 Document Revised: 11/03/2015 Document Reviewed: 05/15/2015 °Elsevier Interactive Patient Education © 2018 [employer] ° °

## 2018-03-08 ENCOUNTER — Ambulatory Visit
Admission: RE | Admit: 2018-03-08 | Discharge: 2018-03-08 | Disposition: A | Discharge status: Home / Self Care | Payer: Worker's Compensation | Source: Ambulatory Visit | Attending: Podiatry | Admitting: Podiatry

## 2018-03-08 ENCOUNTER — Encounter
Admission: RE | Disposition: A | Discharge status: Home / Self Care | Payer: Self-pay | Source: Ambulatory Visit | Attending: Podiatry

## 2018-03-08 ENCOUNTER — Ambulatory Visit: Payer: Worker's Compensation | Admitting: Anesthesiology

## 2018-03-08 DIAGNOSIS — Z79890 Hormone replacement therapy (postmenopausal): Secondary | ICD-10-CM | POA: Diagnosis not present

## 2018-03-08 DIAGNOSIS — Z79899 Other long term (current) drug therapy: Secondary | ICD-10-CM | POA: Insufficient documentation

## 2018-03-08 DIAGNOSIS — M76822 Posterior tibial tendinitis, left leg: Secondary | ICD-10-CM | POA: Insufficient documentation

## 2018-03-08 DIAGNOSIS — M659 Synovitis and tenosynovitis, unspecified: Secondary | ICD-10-CM | POA: Diagnosis not present

## 2018-03-08 DIAGNOSIS — Z791 Long term (current) use of non-steroidal anti-inflammatories (NSAID): Secondary | ICD-10-CM | POA: Insufficient documentation

## 2018-03-08 DIAGNOSIS — S8252XK Displaced fracture of medial malleolus of left tibia, subsequent encounter for closed fracture with nonunion: Secondary | ICD-10-CM | POA: Diagnosis present

## 2018-03-08 DIAGNOSIS — E039 Hypothyroidism, unspecified: Secondary | ICD-10-CM | POA: Diagnosis not present

## 2018-03-08 DIAGNOSIS — X58XXXD Exposure to other specified factors, subsequent encounter: Secondary | ICD-10-CM | POA: Insufficient documentation

## 2018-03-08 HISTORY — PX: TENDON REPAIR: SHX5111

## 2018-03-08 HISTORY — PX: BONE EXCISION: SHX6730

## 2018-03-08 SURGERY — TENDON REPAIR
Anesthesia: General | Site: Ankle | Laterality: Left

## 2018-03-08 MED ORDER — DEXAMETHASONE SODIUM PHOSPHATE 4 MG/ML IJ SOLN
INTRAMUSCULAR | Status: DC | PRN
  Administered 2018-03-08: 4 mg via INTRAVENOUS

## 2018-03-08 MED ORDER — BUPIVACAINE-EPINEPHRINE (PF) 0.25% -1:200000 IJ SOLN
INTRAMUSCULAR | Status: DC | PRN
  Administered 2018-03-08: 10 mL
  Administered 2018-03-08: 9 mL

## 2018-03-08 MED ORDER — OXYCODONE HCL 5 MG PO TABS: 5.0000 mg | ORAL_TABLET | Freq: Once | ORAL | Status: DC | PRN

## 2018-03-08 MED ORDER — FENTANYL CITRATE (PF) 100 MCG/2ML IJ SOLN
50.0000 ug | INTRAMUSCULAR | Status: DC | PRN
  Administered 2018-03-08: 100 ug via INTRAVENOUS

## 2018-03-08 MED ORDER — OXYCODONE-ACETAMINOPHEN 5-325 MG PO TABS: 1.0000 | ORAL_TABLET | ORAL | 0 refills | Status: DC | PRN

## 2018-03-08 MED ORDER — PROPOFOL 10 MG/ML IV BOLUS
INTRAVENOUS | Status: DC | PRN
  Administered 2018-03-08: 140 mg via INTRAVENOUS

## 2018-03-08 MED ORDER — CEFAZOLIN SODIUM-DEXTROSE 2-4 GM/100ML-% IV SOLN
2.0000 g | INTRAVENOUS | Status: AC
  Administered 2018-03-08: 2 g via INTRAVENOUS

## 2018-03-08 MED ORDER — ROPIVACAINE HCL 5 MG/ML IJ SOLN
INTRAMUSCULAR | Status: DC | PRN
  Administered 2018-03-08: 35 mL via EPIDURAL

## 2018-03-08 MED ORDER — PROMETHAZINE HCL 25 MG/ML IJ SOLN: 6.2500 mg | INTRAMUSCULAR | Status: DC | PRN

## 2018-03-08 MED ORDER — ONDANSETRON HCL 4 MG/2ML IJ SOLN
INTRAMUSCULAR | Status: DC | PRN
  Administered 2018-03-08: 4 mg via INTRAVENOUS

## 2018-03-08 MED ORDER — GLYCOPYRROLATE 0.2 MG/ML IJ SOLN
INTRAMUSCULAR | Status: DC | PRN
  Administered 2018-03-08: 0.1 mg via INTRAVENOUS

## 2018-03-08 MED ORDER — FENTANYL CITRATE (PF) 100 MCG/2ML IJ SOLN: 25.0000 ug | INTRAMUSCULAR | Status: DC | PRN

## 2018-03-08 MED ORDER — ONDANSETRON HCL 4 MG PO TABS: 4.0000 mg | ORAL_TABLET | Freq: Four times a day (QID) | ORAL | Status: DC | PRN

## 2018-03-08 MED ORDER — OXYCODONE HCL 5 MG/5ML PO SOLN: 5.0000 mg | Freq: Once | ORAL | Status: DC | PRN

## 2018-03-08 MED ORDER — POVIDONE-IODINE 7.5 % EX SOLN: Freq: Once | CUTANEOUS | Status: DC

## 2018-03-08 MED ORDER — MIDAZOLAM HCL 2 MG/2ML IJ SOLN
1.0000 mg | INTRAMUSCULAR | Status: DC | PRN
  Administered 2018-03-08: 2 mg via INTRAVENOUS

## 2018-03-08 MED ORDER — LACTATED RINGERS IV SOLN
INTRAVENOUS | Status: DC
  Administered 2018-03-08 (×2): via INTRAVENOUS

## 2018-03-08 MED ORDER — LIDOCAINE HCL (CARDIAC) PF 100 MG/5ML IV SOSY
PREFILLED_SYRINGE | INTRAVENOUS | Status: DC | PRN
  Administered 2018-03-08: 40 mg via INTRATRACHEAL

## 2018-03-08 MED ORDER — ONDANSETRON HCL 4 MG/2ML IJ SOLN: 4.0000 mg | Freq: Four times a day (QID) | INTRAMUSCULAR | Status: DC | PRN

## 2018-03-08 SURGICAL SUPPLY — 40 items
BANDAGE ELASTIC 4 LF NS (GAUZE/BANDAGES/DRESSINGS) ×2 IMPLANT
BLADE SURG 15 STRL LF DISP TIS (BLADE) IMPLANT
BLADE SURG 15 STRL SS (BLADE) ×2
BNDG CMPR 75X41 PLY HI ABS (GAUZE/BANDAGES/DRESSINGS)
BNDG CMPR MED 5X4 ELC HKLP NS (GAUZE/BANDAGES/DRESSINGS) ×1
BNDG COHESIVE 4X5 TAN STRL (GAUZE/BANDAGES/DRESSINGS) ×2 IMPLANT
BNDG ESMARK 4X12 TAN STRL LF (GAUZE/BANDAGES/DRESSINGS) ×2 IMPLANT
BNDG GAUZE 4.5X4.1 6PLY STRL (MISCELLANEOUS) ×2 IMPLANT
BNDG STRETCH 4X75 STRL LF (GAUZE/BANDAGES/DRESSINGS) IMPLANT
CANISTER SUCT 1200ML W/VALVE (MISCELLANEOUS) ×2 IMPLANT
COVER LIGHT HANDLE UNIVERSAL (MISCELLANEOUS) ×4 IMPLANT
CUFF TOURN SGL QUICK 18 (TOURNIQUET CUFF) ×1 IMPLANT
DURAPREP 26ML APPLICATOR (WOUND CARE) ×2 IMPLANT
ELECT REM PT RETURN 9FT ADLT (ELECTROSURGICAL) ×2
ELECTRODE REM PT RTRN 9FT ADLT (ELECTROSURGICAL) ×1 IMPLANT
GAUZE PETRO XEROFOAM 1X8 (MISCELLANEOUS) ×2 IMPLANT
GAUZE SPONGE 4X4 12PLY STRL (GAUZE/BANDAGES/DRESSINGS) ×2 IMPLANT
GLOVE BIO SURGEON STRL SZ7.5 (GLOVE) ×3 IMPLANT
GLOVE INDICATOR 8.0 STRL GRN (GLOVE) ×3 IMPLANT
GOWN STRL REUS W/ TWL XL LVL3 (GOWN DISPOSABLE) ×1 IMPLANT
GOWN STRL REUS W/TWL XL LVL3 (GOWN DISPOSABLE) ×2
IV NS 250ML (IV SOLUTION) ×2
IV NS 250ML BAXH (IV SOLUTION) IMPLANT
KIT TURNOVER KIT A (KITS) ×2 IMPLANT
NS IRRIG 500ML POUR BTL (IV SOLUTION) ×2 IMPLANT
PACK EXTREMITY ARMC (MISCELLANEOUS) ×2 IMPLANT
PADDING CAST BLEND 4X4 NS (MISCELLANEOUS) ×1 IMPLANT
PENCIL SMOKE EVACUATOR (MISCELLANEOUS) ×2 IMPLANT
SPONGE XRAY 4X4 16PLY STRL (MISCELLANEOUS) ×1 IMPLANT
STOCKINETTE IMPERVIOUS LG (DRAPES) ×2 IMPLANT
SUT ETHILON 3-0 FS-10 30 BLK (SUTURE) ×2
SUT PDS 2-0 27IN (SUTURE) ×1 IMPLANT
SUT SILK 3 0 (SUTURE) ×2
SUT SILK 3-0 18XBRD TIE 12 (SUTURE) IMPLANT
SUT VIC AB 2-0 SH 27 (SUTURE) ×2
SUT VIC AB 2-0 SH 27XBRD (SUTURE) IMPLANT
SUT VIC AB 3-0 SH 27 (SUTURE) ×2
SUT VIC AB 3-0 SH 27X BRD (SUTURE) IMPLANT
SUTURE EHLN 3-0 FS-10 30 BLK (SUTURE) IMPLANT
WAND TENDON TOPAZ 0 ANGL (MISCELLANEOUS) ×1 IMPLANT

## 2018-03-08 NOTE — Op Note (Addendum)
Operative note   Surgeon:Idus Rathke Armed forces logistics/support/administrative officer: None    Preop diagnosis: 1.  Medial malleolar nonunion 2.  Posterior tibial tendinitis 3.  Ankle synovitis all left ankle and foot  3.  Intra-operative fluoroscopy     Postop diagnosis: Same    Procedure:1.  Excision bone medial malleolus, loose malunion 2.  Tenosynovectomy and debridement posterior tibial tendon 3.  Medial ankle arthrotomy left ankle 4.  Use of fluoroscopy intraoperatively    EBL: Minimal    Anesthesia:local, regional and general.  Local consisted of 10 cc of 0.25% bupivacaine infiltrated along the medial ankle.  A popliteal block had been placed preoperatively.    Hemostasis: Mid calf tourniquet inflated to 200 mmHg for 69 minutes    Specimen: Loose bone medial malleolus    Complications: None    Operative indications:Peter Duncan is an 62 y.o. that presents today for surgical intervention.  The risks/benefits/alternatives/complications have been discussed and consent has been given.    Procedure:  Patient was brought into the OR and placed on the operating table in thesupine position. After anesthesia was obtained theleft lower extremity was prepped and draped in usual sterile fashion.  Attention was directed to the medial aspect of the left ankle where a curvilinear incision was made coursing from the medial malleolus curving posterior to the medial malleolus.  Upon blunt dissection carried down to the tendon sheath and deep medial malleolar region.  The posterior tibial tendon sheath was entered.  There was noted to be a fair amount of tenosynovitis and scarring along the posterior tibial tendon without tearing.  Further evaluation of the anteromedial aspect of the ankle revealed the loose bone along the medial malleolar region.  This was then excised sharply.  This was sent for pathological examination.  At this point the medial gutter of the ankle was then explored.  A small amount of soft tissue and scar  tissue was noted along the medial gutter and this was then excised.  The ankle joint was then flushed with copious amounts of irrigation.  Further evaluation of the posterior tibial tendon revealed the tenosynovitis and this was then removed.  A Topaz wand was then used coursing along the posterior tibial tendon from the posterior malleolus to the posterior tibial insertional site region.  The ankle was evaluated both prior to incision and post removal of exostosis which revealed excision of the exostosis.  The wounds were then flushed with copious amounts of irrigation.  The deeper deltoid and anterior deltoid ligament of the ankle joint was reapproximated with a 2-0 PDS as well as the tendon sheath of the posterior tibial tendon.  Subcutaneous tissue was reapproximated with 2-0 and 3-0 Vicryl and the skin reapproximated with a 3-0 nylon.  A postoperative 0.25% bupivacaine block with epinephrine was infiltrated along the incision site a total of 8 cc was used.  Patient was placed in a well compressive sterile dressing in a boot.    Patient tolerated the procedure and anesthesia well.  Was transported from the OR to the PACU with all vital signs stable and vascular status intact. To be discharged per routine protocol.  Will follow up in approximately 1 week in the outpatient clinic.

## 2018-03-08 NOTE — Anesthesia Procedure Notes (Signed)
Procedure Name: LMA Insertion Date/Time: 03/08/2018 1:18 PM Performed by: Jimmy PicketAmyot, Azharia Surratt, CRNA Pre-anesthesia Checklist: Patient identified, Emergency Drugs available, Suction available, Timeout performed and Patient being monitored Patient Re-evaluated:Patient Re-evaluated prior to induction Oxygen Delivery Method: Circle system utilized Preoxygenation: Pre-oxygenation with 100% oxygen Induction Type: IV induction LMA: LMA inserted LMA Size: 4.0 Number of attempts: 1 Placement Confirmation: positive ETCO2 and breath sounds checked- equal and bilateral Tube secured with: Tape

## 2018-03-08 NOTE — H&P (Signed)
HISTORY AND PHYSICAL INTERVAL NOTE:  03/08/2018  12:00 PM  Peter Duncan  has presented today for surgery, with the diagnosis of Close fracture of left ankle Tibialis tendinitis of left lower extremity.  The various methods of treatment have been discussed with the patient.  No guarantees were given.  After consideration of risks, benefits and other options for treatment, the patient has consented to surgery.  I have reviewed the patients' chart and labs.     A history and physical examination was performed in my office.  The patient was reexamined.  There have been no changes to this history and physical examination.  [name] A

## 2018-03-08 NOTE — Anesthesia Procedure Notes (Signed)
Anesthesia Regional Block: Popliteal block   Pre-Anesthetic Checklist: ,, timeout performed, Correct Patient, Correct Site, Correct Laterality, Correct Procedure, Correct Position, site marked, Risks and benefits discussed,  Surgical consent,  Pre-op evaluation,  At surgeon's request and post-op pain management  Laterality: Left  Prep: chloraprep       Needles:  Injection technique: Single-shot  Needle Type: Echogenic Needle     Needle Length: 9cm  Needle Gauge: 21     Additional Needles:   Procedures:,,,, ultrasound used (permanent image in chart),,,,  Narrative:  Injection made incrementally with aspirations every 5 mL.  Performed by: Personally   Additional Notes: Functioning IV was confirmed and monitors applied. Ultrasound guidance: relevant anatomy identified, needle position confirmed, local anesthetic spread visualized around nerve(s)., vascular puncture avoided.  Image printed for medical record.  Negative aspiration and no paresthesias; incremental administration of local anesthetic. The patient tolerated the procedure well. Vitals signes recorded in RN notes.      

## 2018-03-08 NOTE — Transfer of Care (Signed)
Immediate Anesthesia Transfer of Care Note  Patient: Peter Duncan  Procedure(s) Performed: TENDON REPAIR-FLEXOR TENDON-ANKLE (Left Ankle) BONE EXCISION-TIBIA (Left Ankle)  Patient Location: PACU  Anesthesia Type: General  Level of Consciousness: awake, alert  and patient cooperative  Airway and Oxygen Therapy: Patient Spontanous Breathing and Patient connected to supplemental oxygen  Post-op Assessment: Post-op Vital signs reviewed, Patient's Cardiovascular Status Stable, Respiratory Function Stable, Patent Airway and No signs of Nausea or vomiting  Post-op Vital Signs: Reviewed and stable  Complications: No apparent anesthesia complications

## 2018-03-08 NOTE — Anesthesia Postprocedure Evaluation (Signed)
Anesthesia Post Note  Patient: Peter Duncan  Procedure(s) Performed: TENDON REPAIR-FLEXOR TENDON-ANKLE (Left Ankle) BONE EXCISION-TIBIA (Left Ankle)  Patient location during evaluation: PACU Anesthesia Type: General Level of consciousness: awake and alert Pain management: pain level controlled Vital Signs Assessment: post-procedure vital signs reviewed and stable Respiratory status: spontaneous breathing, nonlabored ventilation, respiratory function stable and patient connected to nasal cannula oxygen Cardiovascular status: blood pressure returned to baseline and stable Postop Assessment: no apparent nausea or vomiting Anesthetic complications: no    Makaiyah Schweiger C

## 2018-03-08 NOTE — Anesthesia Preprocedure Evaluation (Signed)
Anesthesia Evaluation  Patient identified by MRN, date of birth, ID band Patient awake    Reviewed: Allergy & Precautions, NPO status , Patient's Chart, lab work & pertinent test results  Airway Mallampati: II  TM Distance: >3 FB Neck ROM: Full    Dental no notable dental hx.    Pulmonary neg pulmonary ROS,    Pulmonary exam normal breath sounds clear to auscultation       Cardiovascular negative cardio ROS Normal cardiovascular exam Rhythm:Regular Rate:Normal     Neuro/Psych negative neurological ROS  negative psych ROS   GI/Hepatic negative GI ROS, Neg liver ROS,   Endo/Other  Hypothyroidism   Renal/GU negative Renal ROS  negative genitourinary   Musculoskeletal negative musculoskeletal ROS (+)   Abdominal   Peds negative pediatric ROS (+)  Hematology negative hematology ROS (+)   Anesthesia Other Findings   Reproductive/Obstetrics negative OB ROS                             Anesthesia Physical Anesthesia Plan  ASA: II  Anesthesia Plan: General   Post-op Pain Management: GA combined w/ Regional for post-op pain   Induction: Intravenous  PONV Risk Score and Plan:   Airway Management Planned:   Additional Equipment:   Intra-op Plan:   Post-operative Plan: Extubation in OR  Informed Consent: I have reviewed the patients History and Physical, chart, labs and discussed the procedure including the risks, benefits and alternatives for the proposed anesthesia with the patient or authorized representative who has indicated his/her understanding and acceptance.   Dental advisory given  Plan Discussed with: CRNA  Anesthesia Plan Comments:         Anesthesia Quick Evaluation

## 2018-03-09 ENCOUNTER — Encounter: Payer: Self-pay | Admitting: Podiatry

## 2018-03-10 LAB — SURGICAL PATHOLOGY

## 2018-05-02 ENCOUNTER — Telehealth: Payer: Self-pay | Admitting: Family Medicine

## 2018-05-02 NOTE — Telephone Encounter (Signed)
Advised 

## 2018-05-02 NOTE — Telephone Encounter (Signed)
Pt had to be rescheduled for his CPE due to Dr. Reece AgarG. Being out of office to a 2:40pm appt. Pt needing to know what he can do for blood work to be taken since 2:40 is to late to not eat.  Please call pt back at [phone].  Thanks, [employer]

## 2018-05-05 ENCOUNTER — Other Ambulatory Visit: Payer: Self-pay | Admitting: Physician Assistant

## 2018-05-05 DIAGNOSIS — J301 Allergic rhinitis due to pollen: Secondary | ICD-10-CM

## 2018-06-12 ENCOUNTER — Encounter: Payer: Self-pay | Admitting: Family Medicine

## 2018-06-19 ENCOUNTER — Ambulatory Visit (INDEPENDENT_AMBULATORY_CARE_PROVIDER_SITE_OTHER): Payer: BLUE CROSS/BLUE SHIELD | Admitting: Family Medicine

## 2018-06-19 VITALS — BP 118/70 | HR 102 | Temp 98.0°F | Resp 16 | Ht 71.0 in | Wt 188.0 lb

## 2018-06-19 DIAGNOSIS — F5101 Primary insomnia: Secondary | ICD-10-CM | POA: Diagnosis not present

## 2018-06-19 DIAGNOSIS — L309 Dermatitis, unspecified: Secondary | ICD-10-CM | POA: Diagnosis not present

## 2018-06-19 DIAGNOSIS — Z125 Encounter for screening for malignant neoplasm of prostate: Secondary | ICD-10-CM

## 2018-06-19 DIAGNOSIS — Z0000 Encounter for general adult medical examination without abnormal findings: Secondary | ICD-10-CM

## 2018-06-19 DIAGNOSIS — H60543 Acute eczematoid otitis externa, bilateral: Secondary | ICD-10-CM

## 2018-06-19 MED ORDER — TRAZODONE HCL 100 MG PO TABS: 100.0000 mg | ORAL_TABLET | Freq: Every day | ORAL | 12 refills | Status: DC

## 2018-06-19 NOTE — Progress Notes (Signed)
Patient: Peter Duncan, Male    DOB: 09/02/1955, 63 y.o.   MRN: [id number] Visit Date: 06/19/2018  Today's Provider: [name], MD   Chief Complaint  Patient presents with  . Annual Exam   Subjective:  Peter Duncan is a 63 y.o. male who presents today for health maintenance and complete physical. He feels well. He reports exercising none. He reports he is sleeping poorly. He is married and his wife is a retired Runner, broadcasting/film/video.  He plans to retire from the [employer]  in 15 days.  He has 2 daughters and 1 grandson age 64.  He is excited about his upcoming retirement.  Patient reports that he frequently has difficulty sleeping.  He has tried Melatonin and it was not effective.  After discussing it with the patient he states he would like to maybe try Trazodone.  07/12/17 Colonoscopy, Dr Heloise Purpura 10 years.  Review of Systems  Constitutional: Negative.   HENT: Negative.   Eyes: Negative.   Respiratory: Negative.   Cardiovascular: Negative.   Gastrointestinal: Negative.   Endocrine: Negative.   Genitourinary: Negative.   Musculoskeletal: Negative.   Skin: Positive for rash.       Very mild eczema behind both ears.  No infection present.  No break of the skin.  Allergic/Immunologic: Negative.   Neurological: Negative.   Hematological: Negative.   Psychiatric/Behavioral: Negative.     Social History   Socioeconomic History  . Marital status: Married    Spouse name: Not on file  . Number of children: Not on file  . Years of education: Not on file  . Highest education level: Not on file  Occupational History  . Not on file  Social Needs  . Financial resource strain: Not on file  . Food insecurity:    Worry: Not on file    Inability: Not on file  . Transportation needs:    Medical: Not on file    Non-medical: Not on file  Tobacco Use  . Smoking status: Never Smoker  . Smokeless tobacco: Never Used  Substance and Sexual Activity  . Alcohol use: No  . Drug use: No   . Sexual activity: Not on file  Lifestyle  . Physical activity:    Days per week: Not on file    Minutes per session: Not on file  . Stress: Not on file  Relationships  . Social connections:    Talks on phone: Not on file    Gets together: Not on file    Attends religious service: Not on file    Active member of club or organization: Not on file    Attends meetings of clubs or organizations: Not on file    Relationship status: Not on file  . Intimate partner violence:    Fear of current or ex partner: Not on file    Emotionally abused: Not on file    Physically abused: Not on file    Forced sexual activity: Not on file  Other Topics Concern  . Not on file  Social History Narrative  . Not on file    Patient Active Problem List   Diagnosis Date Noted  . Closed left ankle fracture 08/19/2017  . Witnessed episode of apnea 05/12/2016  . Abdominal hernia 05/06/2015    Past Surgical History:  Procedure Laterality Date  . BONE EXCISION Left 03/08/2018   Procedure: BONE EXCISION-TIBIA;  Surgeon: [name], DPM;  Location: [facility];  Service: Podiatry;  Laterality: Left;  .  HERNIA REPAIR Bilateral   . KNEE ARTHROSCOPY Left 11/25/2017   Procedure: ARTHROSCOPY KNEE, PARTIAL MEDIAL MENISECTOMY;  Surgeon: Donato HeinzHooten, James P, MD;  Location: [facility];  Service: Orthopedics;  Laterality: Left;  . ORIF ANKLE FRACTURE Left 08/19/2017   Procedure: OPEN REDUCTION INTERNAL FIXATION (ORIF) ANKLE FRACTURE;  Surgeon: Donato HeinzHooten, James P, MD;  Location: [facility];  Service: Orthopedics;  Laterality: Left;  . TENDON REPAIR Left 03/08/2018   Procedure: TENDON REPAIR-FLEXOR TENDON-ANKLE;  Surgeon: Gwyneth RevelsFowler, Justin, DPM;  [facility];  Service: Podiatry;  Laterality: Left;  GENERAL W/ POPLITEAL  . VASECTOMY    . WISDOM TOOTH EXTRACTION      His family history includes COPD in his father; Diabetes in his mother; Heart disease in his father; Melanoma in his father.      Outpatient Encounter Medications as of 06/19/2018  Medication Sig  . levothyroxine (SYNTHROID, LEVOTHROID) 25 MCG tablet TAKE 1 TABLET BY MOUTH ONCE DAILY BEFORE BREAKFAST (KEEP  FOLLOW  UP  APPOINTMENT)  . loratadine (CLARITIN) 10 MG tablet Take 10 mg by mouth every other day. Alternate days with Claritin D  . loratadine-pseudoephedrine (CLARITIN-D 24-HOUR) 10-240 MG 24 hr tablet TAKE 1 TABLET BY MOUTH ONCE DAILY  . Multiple Vitamins-Minerals (MULTIVITAMIN WITH MINERALS) tablet Take 1 tablet by mouth daily.  . Probiotic Product (PROBIOTIC DAILY PO) Take 1 capsule by mouth daily.  . [DISCONTINUED] oxyCODONE-acetaminophen (PERCOCET) 5-325 MG tablet Take 1 tablet by mouth every 4 (four) hours as needed for severe pain.   No facility-administered encounter medications on file as of 06/19/2018.     Patient Care Team: Maple HudsonGilbert, Brooklyne Radke L Jr., MD as PCP - General (Family Medicine)      Objective:   Vitals:  Vitals:   06/19/18 1501  BP: 118/70  Pulse: (!) 102  Resp: 16  Temp: 98 F (36.7 C)  TempSrc: Oral  SpO2: 97%  Weight: 188 lb (85.3 kg)  Height: 5\' 11"  (1.803 m)    Physical Exam Constitutional:      Appearance: Normal appearance. He is normal weight.  HENT:     Head: Normocephalic and atraumatic.     Right Ear: Tympanic membrane normal.     Left Ear: Tympanic membrane normal.     Nose: Nose normal.     Mouth/Throat:     Mouth: Mucous membranes are moist.     Pharynx: Oropharynx is clear.  Eyes:     Extraocular Movements: Extraocular movements intact.     Conjunctiva/sclera: Conjunctivae normal.     Pupils: Pupils are equal, round, and reactive to light.  Neck:     Musculoskeletal: Normal range of motion and neck supple.  Cardiovascular:     Rate and Rhythm: Normal rate and regular rhythm.     Pulses: Normal pulses.     Heart sounds: Normal heart sounds.  Pulmonary:     Effort: Pulmonary effort is normal.     Breath sounds: Normal breath sounds.  Abdominal:      General: Abdomen is flat. Bowel sounds are normal.     Palpations: Abdomen is soft.  Genitourinary:    Penis: Normal.      Scrotum/Testes: Normal.     Prostate: Normal.     Rectum: Normal.  Musculoskeletal: Normal range of motion.  Skin:    General: Skin is warm.  Neurological:     General: No focal deficit present.     Mental Status: He is alert and oriented to person, place, and time. Mental status  is at baseline.  Psychiatric:        Mood and Affect: Mood normal.        Behavior: Behavior normal.        Thought Content: Thought content normal.        Judgment: Judgment normal.    Fall Risk  06/19/2018 06/09/2017 05/12/2015  Falls in the past year? 0 No No   Depression Screen PHQ 2/9 Scores 06/19/2018 06/09/2017 05/12/2015  PHQ - 2 Score 0 2 0  PHQ- 9 Score - 5 -   Functional Status Survey: Is the patient deaf or have difficulty hearing?: No Does the patient have difficulty seeing, even when wearing glasses/contacts?: No Does the patient have difficulty concentrating, remembering, or making decisions?: No Does the patient have difficulty walking or climbing stairs?: No Does the patient have difficulty dressing or bathing?: No Does the patient have difficulty doing errands alone such as visiting a doctor's office or shopping?: No    Office Visit from 06/19/2018 in [facility]  AUDIT-C Score  0        Assessment & Plan:     Routine Health Maintenance and Physical Exam  Exercise Activities and Dietary recommendations Goals   None     Immunization History  Administered Date(s) Administered  . Influenza Inj Mdck Quad Pf 03/20/2017  . Influenza Split 02/23/2010, 03/04/2011  . Influenza,inj,Quad PF,6+ Mos 03/12/2013, 03/15/2014, 03/14/2015  . Influenza,inj,quad, With Preservative 03/26/2016  . Influenza-Unspecified 03/01/2018  . Tdap 03/04/2011  . Zoster 06/23/2012  . Zoster Recombinat (Shingrix) 07/14/2017, 09/21/2017    Health Maintenance   Topic Date Due  . HIV Screening  04/07/1971  . TETANUS/TDAP  03/03/2021  . COLONOSCOPY  07/13/2027  . INFLUENZA VACCINE  Completed  . Hepatitis C Screening  Completed     Discussed health benefits of physical activity, and encouraged him to engage in regular exercise appropriate for his age and condition.    I have done the exam and reviewed the chart and it is accurate to the best of my knowledge. Dentist has been used and  any errors in dictation or transcription are unintentional. Julieanne Manson M.D. [facility]

## 2018-06-20 LAB — COMPREHENSIVE METABOLIC PANEL
A/G RATIO: 2.1 (ref 1.2–2.2)
ALT: 17 IU/L (ref 0–44)
AST: 18 IU/L (ref 0–40)
Albumin: 4.8 g/dL (ref 3.6–4.8)
Alkaline Phosphatase: 66 IU/L (ref 39–117)
BUN/Creatinine Ratio: 13 (ref 10–24)
BUN: 15 mg/dL (ref 8–27)
Bilirubin Total: 0.5 mg/dL (ref 0.0–1.2)
CALCIUM: 9.8 mg/dL (ref 8.6–10.2)
CO2: 24 mmol/L (ref 20–29)
Chloride: 101 mmol/L (ref 96–106)
Creatinine, Ser: 1.14 mg/dL (ref 0.76–1.27)
GFR calc Af Amer: 79 mL/min/{1.73_m2} (ref >59)
GFR, EST NON AFRICAN AMERICAN: 69 mL/min/{1.73_m2} (ref >59)
GLUCOSE: 91 mg/dL (ref 65–99)
Globulin, Total: 2.3 g/dL (ref 1.5–4.5)
POTASSIUM: 4.4 mmol/L (ref 3.5–5.2)
Sodium: 141 mmol/L (ref 134–144)
TOTAL PROTEIN: 7.1 g/dL (ref 6.0–8.5)

## 2018-06-20 LAB — LIPID PANEL WITH LDL/HDL RATIO
Cholesterol, Total: 217 mg/dL — ABNORMAL HIGH (ref 100–199)
HDL: 67 mg/dL (ref >39)
LDL CALC: 130 mg/dL — AB (ref 0–99)
LDL/HDL RATIO: 1.9 ratio (ref 0.0–3.6)
Triglycerides: 100 mg/dL (ref 0–149)
VLDL Cholesterol Cal: 20 mg/dL (ref 5–40)

## 2018-06-20 LAB — CBC WITH DIFFERENTIAL/PLATELET
BASOS ABS: 0.1 10*3/uL (ref 0.0–0.2)
Basos: 1 %
EOS (ABSOLUTE): 0.1 10*3/uL (ref 0.0–0.4)
Eos: 2 %
Hematocrit: 46.9 % (ref 37.5–51.0)
Hemoglobin: 15.8 g/dL (ref 13.0–17.7)
IMMATURE GRANS (ABS): 0 10*3/uL (ref 0.0–0.1)
IMMATURE GRANULOCYTES: 0 %
LYMPHS: 28 %
Lymphocytes Absolute: 2.3 10*3/uL (ref 0.7–3.1)
MCH: 30.3 pg (ref 26.6–33.0)
MCHC: 33.7 g/dL (ref 31.5–35.7)
MCV: 90 fL (ref 79–97)
MONOS ABS: 0.6 10*3/uL (ref 0.1–0.9)
Monocytes: 8 %
NEUTROS PCT: 61 %
Neutrophils Absolute: 5 10*3/uL (ref 1.4–7.0)
PLATELETS: 255 10*3/uL (ref 150–450)
RBC: 5.21 x10E6/uL (ref 4.14–5.80)
RDW: 13.2 % (ref 11.6–15.4)
WBC: 8 10*3/uL (ref 3.4–10.8)

## 2018-06-20 LAB — POCT URINALYSIS DIPSTICK
Bilirubin, UA: NEGATIVE
Blood, UA: NEGATIVE
GLUCOSE UA: NEGATIVE
KETONES UA: NEGATIVE
Leukocytes, UA: NEGATIVE
Nitrite, UA: NEGATIVE
Protein, UA: NEGATIVE
SPEC GRAV UA: 1.02 (ref 1.010–1.025)
Urobilinogen, UA: 0.2 E.U./dL
pH, UA: 5 (ref 5.0–8.0)

## 2018-06-20 LAB — TSH: TSH: 3.21 u[IU]/mL (ref 0.450–4.500)

## 2018-06-20 LAB — PSA: Prostate Specific Ag, Serum: 1.7 ng/mL (ref 0.0–4.0)

## 2018-06-20 MED ORDER — MOMETASONE FUROATE 0.1 % EX CREA: 1.0000 "application " | TOPICAL_CREAM | Freq: Every day | CUTANEOUS | 0 refills | Status: DC

## 2018-06-21 ENCOUNTER — Other Ambulatory Visit: Payer: Self-pay

## 2018-06-21 MED ORDER — LEVOTHYROXINE SODIUM 25 MCG PO TABS: ORAL_TABLET | ORAL | 3 refills | Status: DC

## 2018-06-21 NOTE — Telephone Encounter (Signed)
Medication was sent into the pharmacy.  

## 2018-08-15 ENCOUNTER — Other Ambulatory Visit: Payer: Self-pay | Admitting: Family Medicine

## 2018-08-15 MED ORDER — LORATADINE 10 MG PO TABS: 10.0000 mg | ORAL_TABLET | Freq: Every day | ORAL | 11 refills | Status: DC

## 2018-08-15 NOTE — Telephone Encounter (Signed)
Pt needing a refill on:  loratadine (CLARITIN) 10 MG tablet- pt is out   Please fill at:  [facility] [address] [place] - [address] [phone] (Phone) [phone] (Fax)   Thanks, [facility]

## 2019-03-14 DIAGNOSIS — Z23 Encounter for immunization: Secondary | ICD-10-CM | POA: Diagnosis not present

## 2019-06-04 ENCOUNTER — Ambulatory Visit: Payer: BC Managed Care – PPO | Attending: Internal Medicine

## 2019-06-04 DIAGNOSIS — Z20822 Contact with and (suspected) exposure to covid-19: Secondary | ICD-10-CM

## 2019-06-04 DIAGNOSIS — Z20828 Contact with and (suspected) exposure to other viral communicable diseases: Secondary | ICD-10-CM | POA: Diagnosis not present

## 2019-06-06 LAB — NOVEL CORONAVIRUS, NAA: SARS-CoV-2, NAA: NOT DETECTED

## 2019-06-26 ENCOUNTER — Encounter: Payer: BLUE CROSS/BLUE SHIELD | Admitting: Family Medicine

## 2019-07-17 NOTE — Progress Notes (Signed)
Patient: Peter Duncan, Male    DOB: 07-13-1955, 64 y.o.   MRN: [id number] Visit Date: 07/19/2019  Today's Provider: [name], MD   Chief Complaint  Patient presents with  . Annual Exam   Subjective:     Annual physical exam Peter Duncan is a 64 y.o. male who presents today for health maintenance and complete physical. He feels fairly well. He reports exercising 4 times weekly. He reports he is sleeping fairly well. Patient and his wife are now both retired. -----------------------------------------------------------------  Colonoscopy: 07/12/2017  Review of Systems  Constitutional: Negative for appetite change, chills, fatigue and fever.  HENT: Negative for congestion, ear pain, hearing loss, nosebleeds and trouble swallowing.   Eyes: Negative for pain and visual disturbance.  Respiratory: Negative for cough, chest tightness and shortness of breath.   Cardiovascular: Negative for chest pain, palpitations and leg swelling.  Gastrointestinal: Negative for abdominal pain, blood in stool, constipation, diarrhea, nausea and vomiting.  Endocrine: Negative for polydipsia, polyphagia and polyuria.  Genitourinary: Negative for dysuria and flank pain.  Musculoskeletal: Negative for arthralgias, back pain, joint swelling, myalgias and neck stiffness.  Skin: Negative for color change, rash and wound.  Neurological: Negative for dizziness, tremors, seizures, speech difficulty, weakness, light-headedness and headaches.  Psychiatric/Behavioral: Negative for behavioral problems, confusion, decreased concentration, dysphoric mood and sleep disturbance. The patient is not nervous/anxious.   All other systems reviewed and are negative.   Social History      He  reports that he has never smoked. He has never used smokeless tobacco. He reports that he does not drink alcohol or use drugs.       Social History   Socioeconomic History  . Marital status: Married    Spouse  name: Not on file  . Number of children: Not on file  . Years of education: Not on file  . Highest education level: Not on file  Occupational History  . Not on file  Tobacco Use  . Smoking status: Never Smoker  . Smokeless tobacco: Never Used  Substance and Sexual Activity  . Alcohol use: No  . Drug use: No  . Sexual activity: Not on file  Other Topics Concern  . Not on file  Social History Narrative  . Not on file   Social Determinants of Health   Financial Resource Strain:   . Difficulty of Paying Living Expenses: Not on file  Food Insecurity:   . Worried About Programme researcher, broadcasting/film/video in the Last Year: Not on file  . Ran Out of Food in the Last Year: Not on file  Transportation Needs:   . Lack of Transportation (Medical): Not on file  . Lack of Transportation (Non-Medical): Not on file  Physical Activity:   . Days of Exercise per Week: Not on file  . Minutes of Exercise per Session: Not on file  Stress:   . Feeling of Stress : Not on file  Social Connections:   . Frequency of Communication with Friends and Family: Not on file  . Frequency of Social Gatherings with Friends and Family: Not on file  . Attends Religious Services: Not on file  . Active Member of Clubs or Organizations: Not on file  . Attends Banker Meetings: Not on file  . Marital Status: Not on file    Past Medical History:  Diagnosis Date  . Hypothyroidism      Patient Active Problem List   Diagnosis Date Noted  .  Closed left ankle fracture 08/19/2017  . Witnessed episode of apnea 05/12/2016  . Abdominal hernia 05/06/2015    Past Surgical History:  Procedure Laterality Date  . BONE EXCISION Left 03/08/2018   Procedure: BONE EXCISION-TIBIA;  Surgeon: Samara Deist, DPM;  Location: [place];  Service: Podiatry;  Laterality: Left;  . HERNIA REPAIR Bilateral   . KNEE ARTHROSCOPY Left 11/25/2017   Procedure: ARTHROSCOPY KNEE, PARTIAL MEDIAL MENISECTOMY;  Surgeon: Dereck Leep, MD;  Location: [facility];  Service: Orthopedics;  Laterality: Left;  . ORIF ANKLE FRACTURE Left 08/19/2017   Procedure: OPEN REDUCTION INTERNAL FIXATION (ORIF) ANKLE FRACTURE;  Surgeon: Dereck Leep, MD;  Location: [facility];  Service: Orthopedics;  Laterality: Left;  . TENDON REPAIR Left 03/08/2018   Procedure: TENDON REPAIR-FLEXOR TENDON-ANKLE;  Surgeon: Samara Deist, DPM;  Location: [place];  Service: Podiatry;  Laterality: Left;  GENERAL W/ POPLITEAL  . VASECTOMY    . WISDOM TOOTH EXTRACTION      Family History        Family Status  Relation Name Status  . Mother  Alive  . Father  Deceased  . Brother  Alive        His family history includes COPD in his father; Diabetes in his mother; Heart disease in his father; Melanoma in his father.      No Known Allergies   Current Outpatient Medications:  .  levothyroxine (SYNTHROID, LEVOTHROID) 25 MCG tablet, TAKE 1 TABLET BY MOUTH ONCE DAILY BEFORE BREAKFAST, Disp: 90 tablet, Rfl: 3 .  loratadine (CLARITIN) 10 MG tablet, Take 1 tablet (10 mg total) by mouth daily., Disp: 30 tablet, Rfl: 11 .  loratadine-pseudoephedrine (CLARITIN-D 24-HOUR) 10-240 MG 24 hr tablet, TAKE 1 TABLET BY MOUTH ONCE DAILY, Disp: 90 tablet, Rfl: 3 .  mometasone (ELOCON) 0.1 % cream, Apply 1 application topically daily., Disp: 45 g, Rfl: 0 .  Multiple Vitamins-Minerals (MULTIVITAMIN WITH MINERALS) tablet, Take 1 tablet by mouth daily., Disp: , Rfl:  .  Probiotic Product (PROBIOTIC DAILY PO), Take 1 capsule by mouth daily., Disp: , Rfl:  .  traZODone (DESYREL) 100 MG tablet, Take 1 tablet (100 mg total) by mouth at bedtime., Disp: 30 tablet, Rfl: 12   Patient Care Team: Jerrol Banana., MD as PCP - General (Family Medicine)    Objective:    Vitals: BP 120/72 (BP Location: Right Arm)   Pulse 84   Temp (!) 96.9 F (36.1 C) (Temporal)   Resp 16   Ht 5\' 11"  (1.803 m)   Wt 200 lb (90.7 kg)   SpO2 96%   BMI 27.89 kg/m    Vitals:    07/19/19 0823  BP: 120/72  Pulse: 84  Resp: 16  Temp: (!) 96.9 F (36.1 C)  TempSrc: Temporal  SpO2: 96%  Weight: 200 lb (90.7 kg)  Height: 5\' 11"  (1.803 m)     Physical Exam Vitals reviewed.  Constitutional:      Appearance: Normal appearance. He is normal weight.  HENT:     Head: Normocephalic and atraumatic.     Right Ear: Tympanic membrane normal.     Left Ear: Tympanic membrane normal.     Nose: Nose normal.     Mouth/Throat:     Mouth: Mucous membranes are moist.     Pharynx: Oropharynx is clear.  Eyes:     Extraocular Movements: Extraocular movements intact.     Conjunctiva/sclera: Conjunctivae normal.     Pupils: Pupils are equal,  round, and reactive to light.  Cardiovascular:     Rate and Rhythm: Normal rate and regular rhythm.     Pulses: Normal pulses.     Heart sounds: Normal heart sounds.  Pulmonary:     Effort: Pulmonary effort is normal.     Breath sounds: Normal breath sounds.  Abdominal:     General: Abdomen is flat. Bowel sounds are normal.     Palpations: Abdomen is soft.  Genitourinary:    Penis: Normal.      Testes: Normal.     Prostate: Normal.     Rectum: Normal.  Musculoskeletal:        General: Normal range of motion.     Cervical back: Normal range of motion and neck supple.  Skin:    General: Skin is warm and dry.  Neurological:     General: No focal deficit present.     Mental Status: He is alert and oriented to person, place, and time. Mental status is at baseline.  Psychiatric:        Mood and Affect: Mood normal.        Behavior: Behavior normal.        Thought Content: Thought content normal.        Judgment: Judgment normal.      Depression Screen PHQ 2/9 Scores 07/19/2019 06/19/2018 06/09/2017 05/12/2015  PHQ - 2 Score 0 0 2 0  PHQ- 9 Score 0 - 5 -       Assessment & Plan:     Routine Health Maintenance and Physical Exam  Exercise Activities and Dietary recommendations Goals   None     Immunization History   Administered Date(s) Administered  . Influenza Inj Mdck Quad Pf 03/20/2017  . Influenza Split 02/23/2010, 03/04/2011  . Influenza,inj,Quad PF,6+ Mos 03/12/2013, 03/15/2014, 03/14/2015  . Influenza,inj,quad, With Preservative 03/26/2016  . Influenza-Unspecified 03/01/2018, 05/15/2019  . Tdap 03/04/2011  . Zoster 06/23/2012  . Zoster Recombinat (Shingrix) 07/14/2017, 09/21/2017    Health Maintenance  Topic Date Due  . HIV Screening  04/07/1971  . TETANUS/TDAP  03/03/2021  . COLONOSCOPY  07/13/2027  . INFLUENZA VACCINE  Completed  . Hepatitis C Screening  Completed     Discussed health benefits of physical activity, and encouraged him to engage in regular exercise appropriate for his age and condition.    -------------------------------------------------------------------- 1. Annual physical exam  - TSH - Lipid Panel With LDL/HDL Ratio - Comprehensive Metabolic Panel (CMET) - CBC with Differential/Platelet - POCT Urinalysis Dipstick - EKG 12-Lead  2. Colon cancer screening  - IFOBT POC (occult bld, rslt in office); Future - IFOBT POC (occult bld, rslt in office)  3. Prostate cancer screening  - PSA  4. Close exposure to COVID-19 virus Patient requesting COVID antibody testing. - SAR CoV2 Serology (COVID 19)AB(IGG)IA   I,Roshena L Chambers,acting as a scribe for [name], MD.,have documented all relevant documentation on the behalf of [name], MD,as directed by  [name], MD while in the presence of [name], MD.  [name], MD  [facility]

## 2019-07-19 ENCOUNTER — Encounter: Payer: Self-pay | Admitting: Family Medicine

## 2019-07-19 ENCOUNTER — Ambulatory Visit (INDEPENDENT_AMBULATORY_CARE_PROVIDER_SITE_OTHER): Payer: BC Managed Care – PPO | Admitting: Family Medicine

## 2019-07-19 ENCOUNTER — Other Ambulatory Visit: Payer: Self-pay

## 2019-07-19 ENCOUNTER — Other Ambulatory Visit: Payer: Self-pay | Admitting: Family Medicine

## 2019-07-19 VITALS — BP 120/72 | HR 84 | Temp 96.9°F | Resp 16 | Ht 71.0 in | Wt 200.0 lb

## 2019-07-19 DIAGNOSIS — Z1211 Encounter for screening for malignant neoplasm of colon: Secondary | ICD-10-CM

## 2019-07-19 DIAGNOSIS — Z20822 Contact with and (suspected) exposure to covid-19: Secondary | ICD-10-CM | POA: Diagnosis not present

## 2019-07-19 DIAGNOSIS — Z125 Encounter for screening for malignant neoplasm of prostate: Secondary | ICD-10-CM

## 2019-07-19 DIAGNOSIS — Z0000 Encounter for general adult medical examination without abnormal findings: Secondary | ICD-10-CM | POA: Diagnosis not present

## 2019-07-19 LAB — POCT URINALYSIS DIPSTICK
Bilirubin, UA: NEGATIVE
Blood, UA: NEGATIVE
Glucose, UA: NEGATIVE
Ketones, UA: NEGATIVE
Leukocytes, UA: NEGATIVE
Nitrite, UA: NEGATIVE
Protein, UA: NEGATIVE
Spec Grav, UA: 1.02 (ref 1.010–1.025)
Urobilinogen, UA: 0.2 E.U./dL
pH, UA: 5 (ref 5.0–8.0)

## 2019-07-19 LAB — IFOBT (OCCULT BLOOD): IFOBT: NEGATIVE

## 2019-07-20 LAB — COMPREHENSIVE METABOLIC PANEL
ALT: 18 IU/L (ref 0–44)
AST: 20 IU/L (ref 0–40)
Albumin/Globulin Ratio: 2.4 — ABNORMAL HIGH (ref 1.2–2.2)
Albumin: 4.5 g/dL (ref 3.8–4.8)
Alkaline Phosphatase: 61 IU/L (ref 39–117)
BUN/Creatinine Ratio: 16 (ref 10–24)
BUN: 16 mg/dL (ref 8–27)
Bilirubin Total: 0.8 mg/dL (ref 0.0–1.2)
CO2: 23 mmol/L (ref 20–29)
Calcium: 9.7 mg/dL (ref 8.6–10.2)
Chloride: 103 mmol/L (ref 96–106)
Creatinine, Ser: 1.03 mg/dL (ref 0.76–1.27)
GFR calc Af Amer: 89 mL/min/{1.73_m2} (ref >59)
GFR calc non Af Amer: 77 mL/min/{1.73_m2} (ref >59)
Globulin, Total: 1.9 g/dL (ref 1.5–4.5)
Glucose: 90 mg/dL (ref 65–99)
Potassium: 4.5 mmol/L (ref 3.5–5.2)
Sodium: 141 mmol/L (ref 134–144)
Total Protein: 6.4 g/dL (ref 6.0–8.5)

## 2019-07-20 LAB — CBC WITH DIFFERENTIAL/PLATELET
Basophils Absolute: 0 10*3/uL (ref 0.0–0.2)
Basos: 1 %
EOS (ABSOLUTE): 0.1 10*3/uL (ref 0.0–0.4)
Eos: 2 %
Hematocrit: 45.3 % (ref 37.5–51.0)
Hemoglobin: 15.4 g/dL (ref 13.0–17.7)
Immature Grans (Abs): 0 10*3/uL (ref 0.0–0.1)
Immature Granulocytes: 0 %
Lymphocytes Absolute: 1.6 10*3/uL (ref 0.7–3.1)
Lymphs: 29 %
MCH: 30.7 pg (ref 26.6–33.0)
MCHC: 34 g/dL (ref 31.5–35.7)
MCV: 90 fL (ref 79–97)
Monocytes Absolute: 0.5 10*3/uL (ref 0.1–0.9)
Monocytes: 10 %
Neutrophils Absolute: 3.1 10*3/uL (ref 1.4–7.0)
Neutrophils: 58 %
Platelets: 206 10*3/uL (ref 150–450)
RBC: 5.01 x10E6/uL (ref 4.14–5.80)
RDW: 13.2 % (ref 11.6–15.4)
WBC: 5.4 10*3/uL (ref 3.4–10.8)

## 2019-07-20 LAB — LIPID PANEL WITH LDL/HDL RATIO
Cholesterol, Total: 217 mg/dL — ABNORMAL HIGH (ref 100–199)
HDL: 59 mg/dL (ref >39)
LDL Chol Calc (NIH): 137 mg/dL — ABNORMAL HIGH (ref 0–99)
LDL/HDL Ratio: 2.3 ratio (ref 0.0–3.6)
Triglycerides: 116 mg/dL (ref 0–149)
VLDL Cholesterol Cal: 21 mg/dL (ref 5–40)

## 2019-07-20 LAB — TSH: TSH: 2.84 u[IU]/mL (ref 0.450–4.500)

## 2019-07-20 LAB — PSA: Prostate Specific Ag, Serum: 2.3 ng/mL (ref 0.0–4.0)

## 2019-07-20 LAB — SAR COV2 SEROLOGY (COVID19)AB(IGG),IA: DiaSorin SARS-CoV-2 Ab, IgG: NEGATIVE

## 2019-07-22 ENCOUNTER — Other Ambulatory Visit: Payer: Self-pay | Admitting: Family Medicine

## 2019-08-09 IMAGING — CR DG FOOT COMPLETE 3+V*L*
3 series · 3 of 3 positions shown · non-contrast
Comparison: None

CLINICAL DATA: Turned LEFT ankle at work, boxes fell off a truck at
work, swelling and deforming at lateral malleolus, initial encounter

EXAM:
LEFT FOOT - COMPLETE 3+ VIEW

[foot ap]
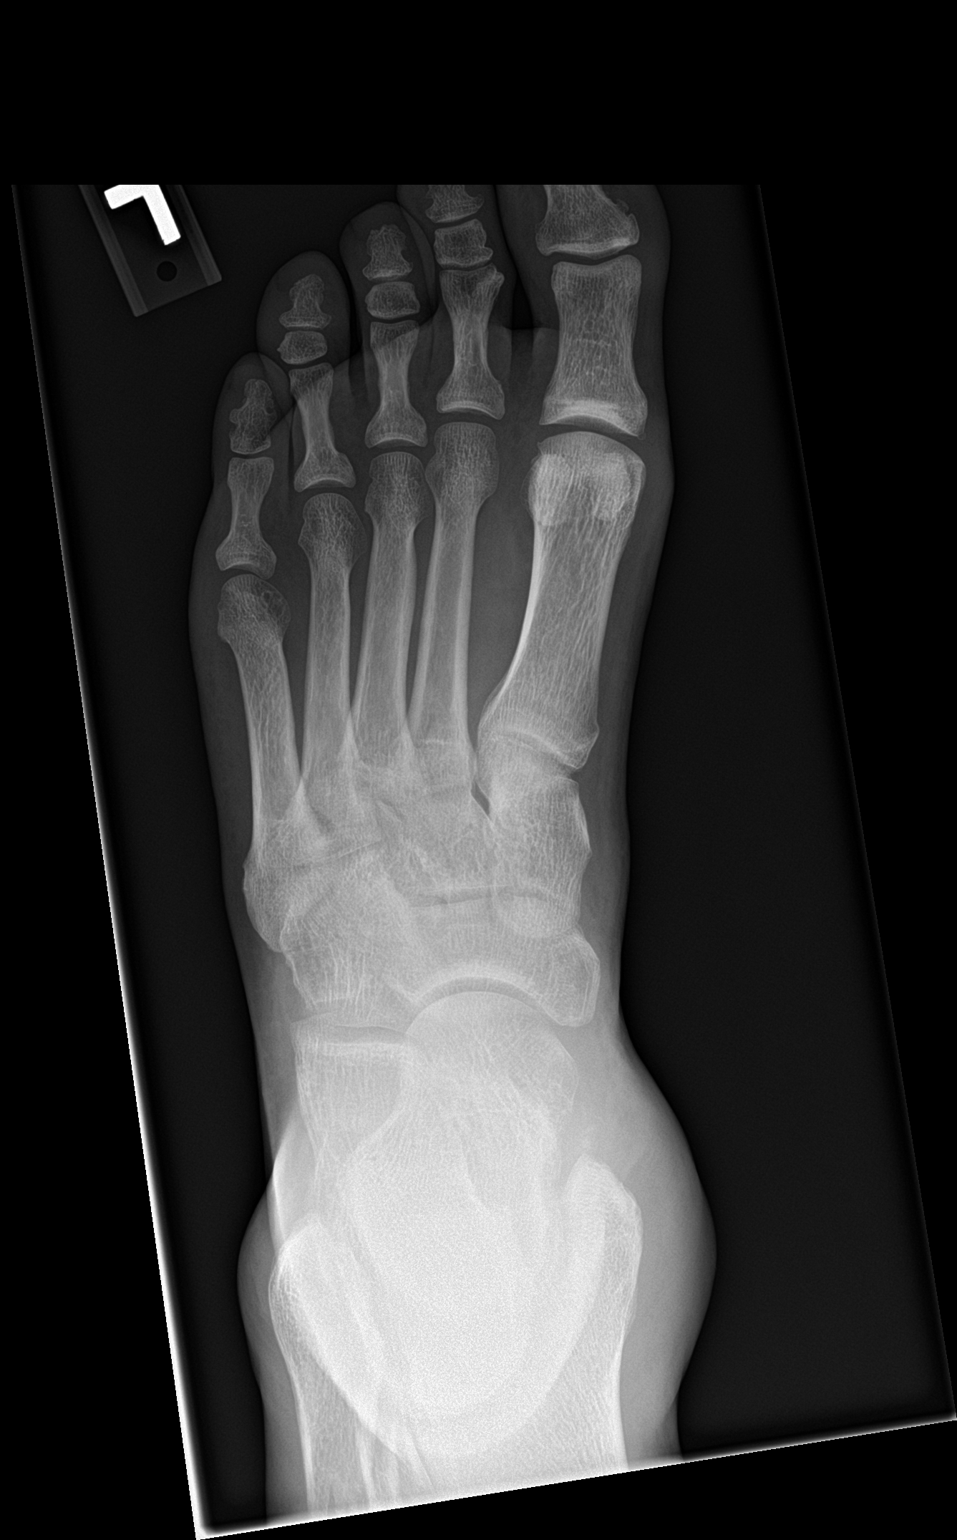

[foot obl]
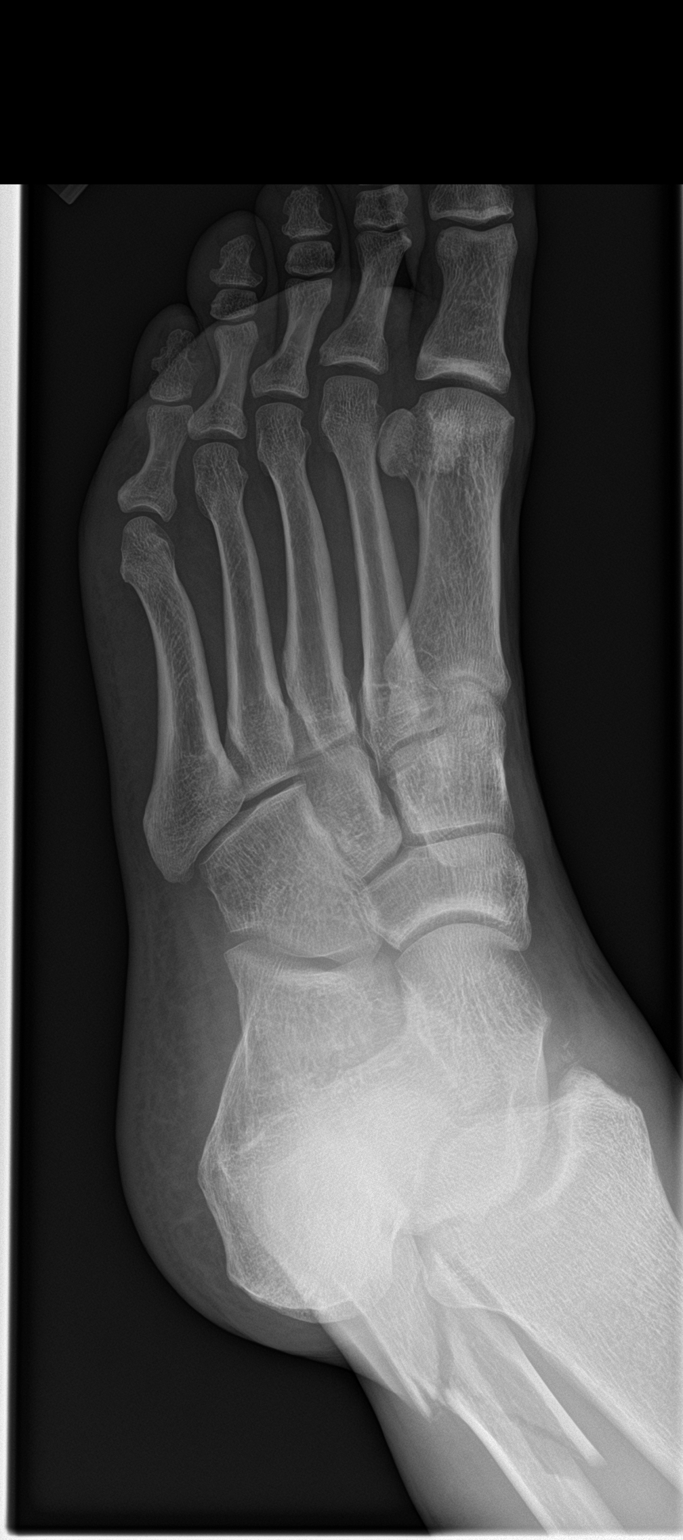

[foot lat]
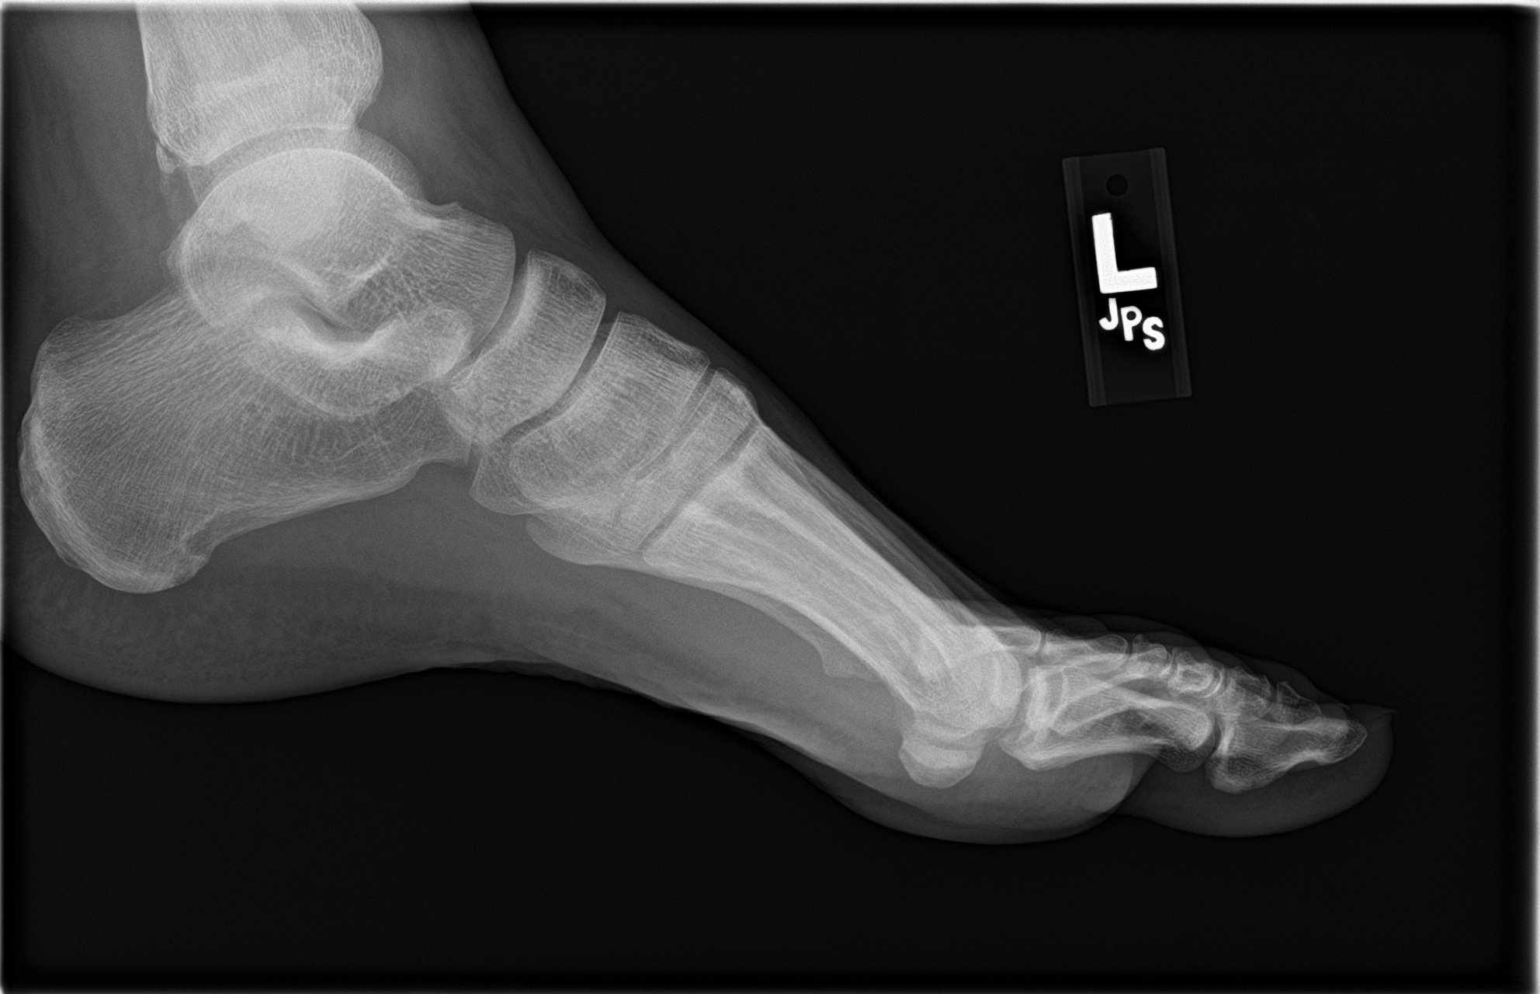

[3 of 3 positions shown; findings below may reference images not displayed]

FINDINGS: Osseous mineralization normal.

Joint spaces preserved.

Comminuted displaced fracture of the distal LEFT fibular diaphysis.

Small avulsion fragments at tip of medial malleolus.

Small posterior malleolar fracture fragment.

No additional fracture or dislocation seen within LEFT foot.
IMPRESSION: Trimalleolar fractures of the LEFT ankle.

No additional LEFT foot fractures identified.

## 2019-10-13 ENCOUNTER — Other Ambulatory Visit: Payer: Self-pay | Admitting: Family Medicine

## 2019-10-13 NOTE — Telephone Encounter (Signed)
Pt had CPE 1 month ago- pt has upcoming visit in 2022 Requested Prescriptions  Pending Prescriptions Disp Refills  . loratadine (CLARITIN) 10 MG tablet [Pharmacy Med Name: EQ LORATADINE 10MG  TAB] 90 tablet 0    Sig: Take 1 tablet by mouth once daily     Ear, Nose, and Throat:  Antihistamines Failed - 10/13/2019  9:10 AM      Failed - Valid encounter within last 12 months    Recent Outpatient Visits          2 months ago Annual physical exam   [facility] [place]   1 year ago Annual physical exam   [facility] [place]   2 years ago Annual physical exam   [facility] [place]   3 years ago Annual physical exam   [facility] [place]   3 years ago Bronchitis   [facility], [place]

## 2019-12-21 ENCOUNTER — Other Ambulatory Visit: Payer: Self-pay | Admitting: Family Medicine

## 2020-03-11 DIAGNOSIS — Z23 Encounter for immunization: Secondary | ICD-10-CM | POA: Diagnosis not present

## 2020-07-18 NOTE — Progress Notes (Signed)
Complete physical exam   Patient: Peter Duncan   DOB: 28-Jun-1955   65 y.o. Male  MRN: [id number] Visit Date: 07/21/2020  Today's healthcare provider: [name], MD   Chief Complaint  Patient presents with  . Annual Exam   Subjective    Peter Duncan is a 65 y.o. male who presents today for a complete physical exam.  He reports consuming a general diet. The patient does not participate in regular exercise at present. He generally feels well. He reports sleeping well. He does not have additional prob lems to discuss today.  He is married and retired as of last year.  He has 2 children and each child has given him a grandchild now ages 25 years old for the oldest and 65-month-old for the youngest. 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  Does drink caffeinated tea when he goes out to eat at night.  Trazodone made him too groggy.  His retirement he has gone from 187 pounds to 200 pounds to 203 pounds today.  He exercises some.   Past Medical History:  Diagnosis Date  . Hypothyroidism    Past Surgical History:  Procedure Laterality Date  . BONE EXCISION Left 03/08/2018   Procedure: BONE EXCISION-TIBIA;  Surgeon: [name], DPM;  Location: [facility];  Service: Podiatry;  Laterality: Left;  . HERNIA REPAIR Bilateral   . KNEE ARTHROSCOPY Left 11/25/2017   Procedure: ARTHROSCOPY KNEE, PARTIAL MEDIAL MENISECTOMY;  Surgeon: [name], MD;  Location: [facility];  Service: Orthopedics;  Laterality: Left;  . ORIF ANKLE FRACTURE Left 08/19/2017   Procedure: OPEN REDUCTION INTERNAL FIXATION (ORIF) ANKLE FRACTURE;  Surgeon: [name], MD;  Location: [facility];  Service: Orthopedics;  Laterality: Left;  . TENDON REPAIR Left 03/08/2018   Procedure: TENDON REPAIR-FLEXOR TENDON-ANKLE;  Surgeon: [name], DPM;  Location: [facility];  Service: Podiatry;  Laterality: Left;  GENERAL W/ POPLITEAL  .  VASECTOMY    . WISDOM TOOTH EXTRACTION     Social History   Socioeconomic History  . Marital status: Married    Spouse name: Not on file  . Number of children: Not on file  . Years of education: Not on file  . Highest education level: Not on file  Occupational History  . Not on file  Tobacco Use  . Smoking status: Never Smoker  . Smokeless tobacco: Never Used  Vaping Use  . Vaping Use: Never used  Substance and Sexual Activity  . Alcohol use: No  . Drug use: No  . Sexual activity: Not on file  Other Topics Concern  . Not on file  Social History Narrative  . Not on file   Social Determinants of Health   Financial Resource Strain: Not on file  Food Insecurity: Not on file  Transportation Needs: Not on file  Physical Activity: Not on file  Stress: Not on file  Social Connections: Not on file  Intimate Partner Violence: Not on file   Family Status  Relation Name Status  . Mother  Alive  . Father  Deceased  . Brother  Alive   Family History  Problem Relation Age of Onset  . Diabetes Mother   . COPD Father   . Heart disease Father   . Melanoma Father    No Known Allergies  Patient Care Team: Maple Hudson., MD as PCP - General (Family Medicine)  Medications: Outpatient Medications Prior to Visit  Medication Sig  . EUTHYROX 25 MCG tablet TAKE 1 TABLET BY MOUTH ONCE DAILY BEFORE BREAKFAST  . loratadine (CLARITIN) 10 MG tablet Take 1 tablet by mouth once daily  . loratadine-pseudoephedrine (CLARITIN-D 24-HOUR) 10-240 MG 24 hr tablet TAKE 1 TABLET BY MOUTH ONCE DAILY  . mometasone (ELOCON) 0.1 % cream Apply 1 application topically daily.  . Multiple Vitamins-Minerals (MULTIVITAMIN WITH MINERALS) tablet Take 1 tablet by mouth daily.  . Probiotic Product (PROBIOTIC DAILY PO) Take 1 capsule by mouth daily.  . traZODone (DESYREL) 100 MG tablet Take 1 tablet (100 mg total) by mouth at bedtime.   No facility-administered medications prior to visit.     Review of Systems  All other systems reviewed and are negative.      Objective    BP 103/83   Pulse 87   Temp 98.3 F (36.8 C)   Resp 16   Ht 5\' 11"  (1.803 m)   Wt 203 lb (92.1 kg)   BMI 28.31 kg/m  Wt Readings from Last 3 Encounters:  07/21/20 203 lb (92.1 kg)  07/19/19 200 lb (90.7 kg)  06/19/18 188 lb (85.3 kg)      Physical Exam Vitals reviewed.  Constitutional:      Appearance: Normal appearance. He is normal weight.  HENT:     Head: Normocephalic and atraumatic.     Right Ear: Tympanic membrane normal.     Left Ear: Tympanic membrane normal.     Nose: Nose normal.     Mouth/Throat:     Mouth: Mucous membranes are moist.     Pharynx: Oropharynx is clear.  Eyes:     Extraocular Movements: Extraocular movements intact.     Conjunctiva/sclera: Conjunctivae normal.     Pupils: Pupils are equal, round, and reactive to light.  Cardiovascular:     Rate and Rhythm: Normal rate and regular rhythm.     Pulses: Normal pulses.     Heart sounds: Normal heart sounds.  Pulmonary:     Effort: Pulmonary effort is normal.     Breath sounds: Normal breath sounds.  Abdominal:     General: Bowel sounds are normal.     Palpations: Abdomen is soft.  Genitourinary:    Penis: Normal.      Testes: Normal.  Musculoskeletal:     Cervical back: Normal range of motion and neck supple.  Skin:    General: Skin is warm and dry.  Neurological:     General: No focal deficit present.     Mental Status: He is alert and oriented to person, place, and time.  Psychiatric:        Mood and Affect: Mood normal.        Behavior: Behavior normal.        Thought Content: Thought content normal.        Judgment: Judgment normal.       Last depression screening scores PHQ 2/9 Scores 07/21/2020 07/19/2019 06/19/2018  PHQ - 2 Score 0 0 0  PHQ- 9 Score 0 0 -   Last fall risk screening Fall Risk  07/21/2020  Falls in the past year? 0  Number falls in past yr: 0  Injury with Fall? 0  Risk  for fall due to : No Fall Risks  Follow up Falls evaluation completed   Last Audit-C alcohol use screening Alcohol Use Disorder Test (AUDIT) 07/21/2020  1. How often do you have a drink containing alcohol? 0  2. How many drinks containing alcohol do you have on a typical day when you are drinking? 0  3. How often do you have six or more drinks on one occasion? 0  AUDIT-C Score 0  Alcohol Brief Interventions/Follow-up AUDIT Score <7 follow-up not indicated   A score of 3 or more in women, and 4 or more in men indicates increased risk for alcohol abuse, EXCEPT if all of the points are from question 1   No results found for any visits on 07/21/20.  Assessment & Plan    Routine Health Maintenance and Physical Exam  Exercise Activities and Dietary recommendations Goals   None     Immunization History  Administered Date(s) Administered  . Influenza Inj Mdck Quad Pf 03/20/2017  . Influenza Split 02/23/2010, 03/04/2011  . Influenza,inj,Quad PF,6+ Mos 03/12/2013, 03/15/2014, 03/14/2015  . Influenza,inj,quad, With Preservative 03/26/2016  . Influenza-Unspecified 03/01/2018, 05/15/2019  . Tdap 03/04/2011  . Zoster 06/23/2012  . Zoster Recombinat (Shingrix) 07/14/2017, 09/21/2017    Health Maintenance  Topic Date Due  . COVID-19 Vaccine (1) Never done  . HIV Screening  Never done  . INFLUENZA VACCINE  01/13/2020  . TETANUS/TDAP  03/03/2021  . COLONOSCOPY (Pts 45-39yrs Insurance coverage will need to be confirmed)  07/13/2027  . Hepatitis C Screening  Completed    Discussed health benefits of physical activity, and encouraged him to engage in regular exercise appropriate for his age and condition.  1. Annual physical exam PSA ordered, colonoscopy due in 2029. Update Tdap.  As he may have more grandchildren in the future. - Lipid panel - TSH - CBC w/Diff/Platelet - Comprehensive Metabolic Panel (CMET)  2. Prostate cancer screening  - PSA  3. Screening for blood or protein  in urine  - POCT urinalysis dipstick  4. Need for Td vaccine  - Td : Tetanus/diphtheria >7yo Preservative  free  5. Primary insomnia    No follow-ups on file.     I, [name], MD, have reviewed all documentation for this visit. The documentation on 07/21/20 for the exam, diagnosis, procedures, and orders are all accurate and complete.    Juneau Doughman Wendelyn Breslow, MD  [facility] [phone] (phone) [phone] (fax)  [facility]

## 2020-07-21 ENCOUNTER — Other Ambulatory Visit: Payer: Self-pay | Admitting: Family Medicine

## 2020-07-21 ENCOUNTER — Other Ambulatory Visit: Payer: Self-pay

## 2020-07-21 ENCOUNTER — Ambulatory Visit (INDEPENDENT_AMBULATORY_CARE_PROVIDER_SITE_OTHER): Payer: BC Managed Care – PPO | Admitting: Family Medicine

## 2020-07-21 ENCOUNTER — Encounter: Payer: Self-pay | Admitting: Family Medicine

## 2020-07-21 VITALS — BP 103/83 | HR 87 | Temp 98.3°F | Resp 16 | Ht 71.0 in | Wt 203.0 lb

## 2020-07-21 DIAGNOSIS — Z0000 Encounter for general adult medical examination without abnormal findings: Secondary | ICD-10-CM | POA: Diagnosis not present

## 2020-07-21 DIAGNOSIS — F5101 Primary insomnia: Secondary | ICD-10-CM

## 2020-07-21 DIAGNOSIS — Z125 Encounter for screening for malignant neoplasm of prostate: Secondary | ICD-10-CM | POA: Diagnosis not present

## 2020-07-21 DIAGNOSIS — Z1389 Encounter for screening for other disorder: Secondary | ICD-10-CM

## 2020-07-21 DIAGNOSIS — Z23 Encounter for immunization: Secondary | ICD-10-CM

## 2020-07-21 LAB — POCT URINALYSIS DIPSTICK
Bilirubin, UA: NEGATIVE
Blood, UA: NEGATIVE
Glucose, UA: NEGATIVE
Ketones, UA: NEGATIVE
Leukocytes, UA: NEGATIVE
Nitrite, UA: NEGATIVE
Protein, UA: NEGATIVE
Spec Grav, UA: >=1.03 — AB (ref 1.010–1.025)
Urobilinogen, UA: 0.2 E.U./dL
pH, UA: 6 (ref 5.0–8.0)

## 2020-07-22 LAB — COMPREHENSIVE METABOLIC PANEL
ALT: 22 IU/L (ref 0–44)
AST: 21 IU/L (ref 0–40)
Albumin/Globulin Ratio: 1.9 (ref 1.2–2.2)
Albumin: 4.5 g/dL (ref 3.8–4.8)
Alkaline Phosphatase: 62 IU/L (ref 44–121)
BUN/Creatinine Ratio: 12 (ref 10–24)
BUN: 13 mg/dL (ref 8–27)
Bilirubin Total: 0.6 mg/dL (ref 0.0–1.2)
CO2: 22 mmol/L (ref 20–29)
Calcium: 9.7 mg/dL (ref 8.6–10.2)
Chloride: 103 mmol/L (ref 96–106)
Creatinine, Ser: 1.13 mg/dL (ref 0.76–1.27)
GFR calc Af Amer: 79 mL/min/{1.73_m2} (ref >59)
GFR calc non Af Amer: 68 mL/min/{1.73_m2} (ref >59)
Globulin, Total: 2.4 g/dL (ref 1.5–4.5)
Glucose: 94 mg/dL (ref 65–99)
Potassium: 4.7 mmol/L (ref 3.5–5.2)
Sodium: 142 mmol/L (ref 134–144)
Total Protein: 6.9 g/dL (ref 6.0–8.5)

## 2020-07-22 LAB — TSH: TSH: 2.84 u[IU]/mL (ref 0.450–4.500)

## 2020-07-22 LAB — CBC WITH DIFFERENTIAL/PLATELET
Basophils Absolute: 0 10*3/uL (ref 0.0–0.2)
Basos: 1 %
EOS (ABSOLUTE): 0.3 10*3/uL (ref 0.0–0.4)
Eos: 4 %
Hematocrit: 49.2 % (ref 37.5–51.0)
Hemoglobin: 16.2 g/dL (ref 13.0–17.7)
Immature Grans (Abs): 0 10*3/uL (ref 0.0–0.1)
Immature Granulocytes: 0 %
Lymphocytes Absolute: 2 10*3/uL (ref 0.7–3.1)
Lymphs: 29 %
MCH: 30.3 pg (ref 26.6–33.0)
MCHC: 32.9 g/dL (ref 31.5–35.7)
MCV: 92 fL (ref 79–97)
Monocytes Absolute: 0.6 10*3/uL (ref 0.1–0.9)
Monocytes: 9 %
Neutrophils Absolute: 4.2 10*3/uL (ref 1.4–7.0)
Neutrophils: 57 %
Platelets: 216 10*3/uL (ref 150–450)
RBC: 5.34 x10E6/uL (ref 4.14–5.80)
RDW: 13 % (ref 11.6–15.4)
WBC: 7.1 10*3/uL (ref 3.4–10.8)

## 2020-07-22 LAB — LIPID PANEL
Chol/HDL Ratio: 3.9 ratio (ref 0.0–5.0)
Cholesterol, Total: 236 mg/dL — ABNORMAL HIGH (ref 100–199)
HDL: 60 mg/dL (ref >39)
LDL Chol Calc (NIH): 153 mg/dL — ABNORMAL HIGH (ref 0–99)
Triglycerides: 132 mg/dL (ref 0–149)
VLDL Cholesterol Cal: 23 mg/dL (ref 5–40)

## 2020-07-22 LAB — PSA: Prostate Specific Ag, Serum: 1.8 ng/mL (ref 0.0–4.0)

## 2020-10-14 ENCOUNTER — Other Ambulatory Visit: Payer: Self-pay | Admitting: Family Medicine

## 2020-11-19 ENCOUNTER — Telehealth: Payer: Self-pay

## 2020-11-19 MED ORDER — LEVOTHYROXINE SODIUM 25 MCG PO TABS: 25.0000 ug | ORAL_TABLET | Freq: Every day | ORAL | 2 refills | Status: DC

## 2020-11-19 NOTE — Telephone Encounter (Signed)
Rx sent to pharmacy   

## 2020-11-19 NOTE — Telephone Encounter (Signed)
[facility] needs new RX for Euthyrox 25 mcg #90 w/ 3 RF

## 2021-02-23 DIAGNOSIS — C44519 Basal cell carcinoma of skin of other part of trunk: Secondary | ICD-10-CM | POA: Diagnosis not present

## 2021-02-23 DIAGNOSIS — X32XXXA Exposure to sunlight, initial encounter: Secondary | ICD-10-CM | POA: Diagnosis not present

## 2021-02-23 DIAGNOSIS — L570 Actinic keratosis: Secondary | ICD-10-CM | POA: Diagnosis not present

## 2021-02-23 DIAGNOSIS — D2262 Melanocytic nevi of left upper limb, including shoulder: Secondary | ICD-10-CM | POA: Diagnosis not present

## 2021-02-23 DIAGNOSIS — D2271 Melanocytic nevi of right lower limb, including hip: Secondary | ICD-10-CM | POA: Diagnosis not present

## 2021-02-23 DIAGNOSIS — D225 Melanocytic nevi of trunk: Secondary | ICD-10-CM | POA: Diagnosis not present

## 2021-02-23 DIAGNOSIS — D2261 Melanocytic nevi of right upper limb, including shoulder: Secondary | ICD-10-CM | POA: Diagnosis not present

## 2021-02-23 DIAGNOSIS — D485 Neoplasm of uncertain behavior of skin: Secondary | ICD-10-CM | POA: Diagnosis not present

## 2021-05-25 LAB — HM DIABETES FOOT EXAM: HM Diabetic Foot Exam: NORMAL

## 2021-07-22 ENCOUNTER — Ambulatory Visit (INDEPENDENT_AMBULATORY_CARE_PROVIDER_SITE_OTHER): Payer: Medicare Other | Admitting: Family Medicine

## 2021-07-22 ENCOUNTER — Other Ambulatory Visit: Payer: Self-pay

## 2021-07-22 ENCOUNTER — Encounter: Payer: Self-pay | Admitting: Family Medicine

## 2021-07-22 VITALS — BP 125/86 | HR 82 | Temp 98.3°F | Resp 16 | Ht 71.0 in | Wt 204.0 lb

## 2021-07-22 DIAGNOSIS — Z0000 Encounter for general adult medical examination without abnormal findings: Secondary | ICD-10-CM | POA: Diagnosis not present

## 2021-07-22 DIAGNOSIS — Z23 Encounter for immunization: Secondary | ICD-10-CM | POA: Diagnosis not present

## 2021-07-22 NOTE — Progress Notes (Deleted)
Complete physical exam  I,Peter Duncan,acting as a scribe for Peter Mans, MD.,have documented all relevant documentation on the behalf of Peter Mans, MD,as directed by  Peter Mans, MD while in the presence of Peter Mans, MD.   Patient: Peter Duncan   DOB: Jul 28, 1955   66 y.o. Male  MRN: [id number] Visit Date: 07/22/2021  Today's healthcare provider: [name], MD   No chief complaint on file.  Subjective    Peter Duncan is a 66 y.o. male who presents today for a complete physical exam.  He reports consuming a {diet types:17450} diet. {Exercise:19826} He generally feels {well/fairly well/poorly:18703}. He reports sleeping {well/fairly well/poorly:18703}. He {does/does not:200015} have additional problems to discuss today.  HPI  ***  Past Medical History:  Diagnosis Date   Hypothyroidism    Past Surgical History:  Procedure Laterality Date   BONE EXCISION Left 03/08/2018   Procedure: BONE EXCISION-TIBIA;  Surgeon: [name], DPM;  Location: [facility];  Service: Podiatry;  Laterality: Left;   HERNIA REPAIR Bilateral    KNEE ARTHROSCOPY Left 11/25/2017   Procedure: ARTHROSCOPY KNEE, PARTIAL MEDIAL MENISECTOMY;  Surgeon: [name], MD;  Location: [facility];  Service: Orthopedics;  Laterality: Left;   ORIF ANKLE FRACTURE Left 08/19/2017   Procedure: OPEN REDUCTION INTERNAL FIXATION (ORIF) ANKLE FRACTURE;  Surgeon: [name], MD;  Location: [facility];  Service: Orthopedics;  Laterality: Left;   TENDON REPAIR Left 03/08/2018   Procedure: TENDON REPAIR-FLEXOR TENDON-ANKLE;  Surgeon: [name], DPM;  Location: [facility];  Service: Podiatry;  Laterality: Left;  GENERAL W/ POPLITEAL   VASECTOMY     WISDOM TOOTH EXTRACTION     Social History   Socioeconomic History   Marital status: Married    Spouse name: Not on file   Number of children: Not on file   Years of education: Not on file   Highest  education level: Not on file  Occupational History   Not on file  Tobacco Use   Smoking status: Never   Smokeless tobacco: Never  Vaping Use   Vaping Use: Never used  Substance and Sexual Activity   Alcohol use: No   Drug use: No   Sexual activity: Not on file  Other Topics Concern   Not on file  Social History Narrative   Not on file   Social Determinants of Health   Financial Resource Strain: Not on file  Food Insecurity: Not on file  Transportation Needs: Not on file  Physical Activity: Not on file  Stress: Not on file  Social Connections: Not on file  Intimate Partner Violence: Not on file   Family Status  Relation Name Status   Mother  Alive   Father  Deceased   Brother  Alive   Family History  Problem Relation Age of Onset   Diabetes Mother    COPD Father    Heart disease Father    Melanoma Father    No Known Allergies  Patient Care Team: Maple Hudson., MD as PCP - General (Family Medicine)   Medications: Outpatient Medications Prior to Visit  Medication Sig   levothyroxine (EUTHYROX) 25 MCG tablet Take 1 tablet (25 mcg total) by mouth daily before breakfast.   loratadine (CLARITIN) 10 MG tablet Take 1 tablet by mouth once daily   loratadine-pseudoephedrine (CLARITIN-D 24-HOUR) 10-240 MG 24 hr tablet TAKE 1 TABLET BY MOUTH ONCE DAILY   mometasone (ELOCON) 0.1 %  cream Apply 1 application topically daily.   Multiple Vitamins-Minerals (MULTIVITAMIN WITH MINERALS) tablet Take 1 tablet by mouth daily.   Probiotic Product (PROBIOTIC DAILY PO) Take 1 capsule by mouth daily.   traZODone (DESYREL) 100 MG tablet Take 1 tablet (100 mg total) by mouth at bedtime.   No facility-administered medications prior to visit.    Review of Systems  All other systems reviewed and are negative.  {Labs   Heme   Chem   Endocrine   Serology   Results Review (optional):23779}  Objective    There were no vitals taken for this visit. {Show previous vital signs  (optional):23777}  Physical Exam  ***  Last depression screening scores PHQ 2/9 Scores 07/21/2020 07/19/2019 06/19/2018  PHQ - 2 Score 0 0 0  PHQ- 9 Score 0 0 -   Last fall risk screening Fall Risk  07/21/2020  Falls in the past year? 0  Number falls in past yr: 0  Injury with Fall? 0  Risk for fall due to : No Fall Risks  Follow up Falls evaluation completed   Last Audit-C alcohol use screening Alcohol Use Disorder Test (AUDIT) 07/21/2020  1. How often do you have a drink containing alcohol? 0  2. How many drinks containing alcohol do you have on a typical day when you are drinking? 0  3. How often do you have six or more drinks on one occasion? 0  AUDIT-C Score 0  Alcohol Brief Interventions/Follow-up AUDIT Score <7 follow-up not indicated   A score of 3 or more in women, and 4 or more in men indicates increased risk for alcohol abuse, EXCEPT if all of the points are from question 1   No results found for any visits on 07/22/21.  Assessment & Plan    Routine Health Maintenance and Physical Exam  Exercise Activities and Dietary recommendations  Goals   None     Immunization History  Administered Date(s) Administered   Fluad Quad(high Dose 65+) 03/16/2021   Influenza Inj Mdck Quad Pf 03/20/2017   Influenza Split 02/23/2010, 03/04/2011   Influenza,inj,Quad PF,6+ Mos 03/12/2013, 03/15/2014, 03/14/2015   Influenza,inj,quad, With Preservative 03/26/2016   Influenza-Unspecified 03/01/2018, 05/15/2019   PFIZER(Purple Top)SARS-COV-2 Vaccination 09/04/2019, 09/25/2019   Td 07/21/2020   Tdap 03/04/2011   Zoster Recombinat (Shingrix) 07/14/2017, 09/21/2017   Zoster, Live 06/23/2012    Health Maintenance  Topic Date Due   HIV Screening  Never done   COVID-19 Vaccine (3 - Booster for Pfizer series) 11/20/2019   Pneumonia Vaccine 46+ Years old (1 - PCV) Never done   COLONOSCOPY (Pts 45-23yrs Insurance coverage will need to be confirmed)  07/13/2027   TETANUS/TDAP  07/21/2030    INFLUENZA VACCINE  Completed   Hepatitis C Screening  Completed   Zoster Vaccines- Shingrix  Completed   HPV VACCINES  Aged Out    Discussed health benefits of physical activity, and encouraged him to engage in regular exercise appropriate for his age and condition.  ***  No follow-ups on file.     {provider attestation***:1}   Peter Mans, MD  [facility] [phone] (phone) [phone] (fax)  [facility]

## 2021-07-22 NOTE — Progress Notes (Signed)
Medicare Initial Preventative Physical Exam    I,Peter Duncan,acting as a scribe for Peter Mans, MD.,have documented all relevant documentation on the behalf of Peter Mans, MD,as directed by  Peter Mans, MD while in the presence of Peter Mans, MD.    Patient: Peter Duncan, Male    DOB: Dec 06, 1955, 66 y.o.   MRN: [id number] Visit Date: 07/22/2021  Today's Provider: [name], MD   Subjective:    Chief Complaint  Patient presents with   Annual Exam    Medicare Initial Preventative Physical Exam [name] is a 66 y.o. male who presents today for his Initial Preventative Physical Exam.  Welcome to Medicare exam. HPI  Social History   Socioeconomic History   Marital status: Married    Spouse name: Not on file   Number of children: Not on file   Years of education: Not on file   Highest education level: Not on file  Occupational History   Not on file  Tobacco Use   Smoking status: Never   Smokeless tobacco: Never  Vaping Use   Vaping Use: Never used  Substance and Sexual Activity   Alcohol use: No   Drug use: No   Sexual activity: Not on file  Other Topics Concern   Not on file  Social History Narrative   Not on file   Social Determinants of Health   Financial Resource Strain: Not on file  Food Insecurity: Not on file  Transportation Needs: Not on file  Physical Activity: Not on file  Stress: Not on file  Social Connections: Not on file  Intimate Partner Violence: Not on file    Past Medical History:  Diagnosis Date   Hypothyroidism      Patient Active Problem List   Diagnosis Date Noted   Closed left ankle fracture 08/19/2017   Witnessed episode of apnea 05/12/2016   Abdominal hernia 05/06/2015    Past Surgical History:  Procedure Laterality Date   BONE EXCISION Left 03/08/2018   Procedure: BONE EXCISION-TIBIA;  Surgeon: Peter Duncan, DPM;  Location: [facility];  Service: Podiatry;   Laterality: Left;   HERNIA REPAIR Bilateral    KNEE ARTHROSCOPY Left 11/25/2017   Procedure: ARTHROSCOPY KNEE, PARTIAL MEDIAL MENISECTOMY;  Surgeon: Peter Heinz, MD;  Location: [facility];  Service: Orthopedics;  Laterality: Left;   ORIF ANKLE FRACTURE Left 08/19/2017   Procedure: OPEN REDUCTION INTERNAL FIXATION (ORIF) ANKLE FRACTURE;  Surgeon: Peter Heinz, MD;  Location: [facility];  Service: Orthopedics;  Laterality: Left;   TENDON REPAIR Left 03/08/2018   Procedure: TENDON REPAIR-FLEXOR TENDON-ANKLE;  Surgeon: Peter Duncan, DPM;  Location: [facility];  Service: Podiatry;  Laterality: Left;  GENERAL W/ POPLITEAL   VASECTOMY     WISDOM TOOTH EXTRACTION      His family history includes COPD in his father; Diabetes in his mother; Heart disease in his father; Melanoma in his father.   Current Outpatient Medications:    levothyroxine (EUTHYROX) 25 MCG tablet, Take 1 tablet (25 mcg total) by mouth daily before breakfast., Disp: 90 tablet, Rfl: 2   loratadine-pseudoephedrine (CLARITIN-D 24-HOUR) 10-240 MG 24 hr tablet, TAKE 1 TABLET BY MOUTH ONCE DAILY, Disp: 90 tablet, Rfl: 3   Multiple Vitamins-Minerals (MULTIVITAMIN WITH MINERALS) tablet, Take 1 tablet by mouth daily., Disp: , Rfl:    Probiotic Product (PROBIOTIC DAILY PO), Take 1 capsule by mouth daily., Disp: , Rfl:    Patient Care Team: Sullivan Lone,  Leonette Monarch., MD as PCP - General (Family Medicine)  Review of Systems  All other systems reviewed and are negative.      Objective:    Vitals: BP 125/86 (BP Location: Right Arm, Patient Position: Sitting, Cuff Size: Large)    Pulse 82    Temp 98.3 F (36.8 C) (Temporal)    Resp 16    Ht 5\' 11"  (1.803 m)    Wt 204 lb (92.5 kg)    SpO2 95%    BMI 28.45 kg/m  Vision Screening   Right eye Left eye Both eyes  Without correction 20/40 20/20 20/20   With correction      Physical Exam Vitals reviewed.  Constitutional:      Appearance: Normal appearance. He is normal weight.   HENT:     Head: Normocephalic and atraumatic.     Right Ear: Tympanic membrane normal.     Left Ear: Tympanic membrane normal.     Nose: Nose normal.     Mouth/Throat:     Mouth: Mucous membranes are moist.     Pharynx: Oropharynx is clear.  Eyes:     Extraocular Movements: Extraocular movements intact.     Conjunctiva/sclera: Conjunctivae normal.     Pupils: Pupils are equal, round, and reactive to light.  Cardiovascular:     Rate and Rhythm: Normal rate and regular rhythm.     Pulses: Normal pulses.     Heart sounds: Normal heart sounds.  Pulmonary:     Effort: Pulmonary effort is normal.     Breath sounds: Normal breath sounds.  Abdominal:     General: Bowel sounds are normal.     Palpations: Abdomen is soft.  Genitourinary:    Penis: Normal.      Testes: Normal.  Musculoskeletal:     Cervical back: Normal range of motion and neck supple.  Skin:    General: Skin is warm and dry.  Neurological:     General: No focal deficit present.     Mental Status: He is alert and oriented to person, place, and time.  Psychiatric:        Mood and Affect: Mood normal.        Behavior: Behavior normal.        Thought Content: Thought content normal.        Judgment: Judgment normal.    Vision Screening   Right eye Left eye Both eyes  Without correction 20/40 20/20 20/20   With correction       Activities of Daily Living In your present state of health, do you have any difficulty performing the following activities: 07/22/2021  Hearing? N  Vision? N  Difficulty concentrating or making decisions? N  Walking or climbing stairs? N  Dressing or bathing? N  Doing errands, shopping? N  Some recent data might be hidden    Fall Risk Assessment Fall Risk  07/22/2021 07/21/2020 07/19/2019 06/19/2018 06/09/2017  Falls in the past year? 0 0 0 0 No  Number falls in past yr: 0 0 0 - -  Injury with Fall? 0 0 0 - -  Risk for fall due to : No Fall Risks No Fall Risks - - -  Follow up Falls  evaluation completed Falls evaluation completed Falls evaluation completed - -     Depression Screen PHQ 2/9 Scores 07/22/2021 07/21/2020 07/19/2019 06/19/2018  PHQ - 2 Score 0 0 0 0  PHQ- 9 Score 0 0 0 -    No flowsheet data  found.    Assessment & Plan:    Initial Preventative Physical Exam  Reviewed patient's Family Medical History Reviewed and updated list of patient's medical providers Assessment of cognitive impairment was done Assessed patient's functional ability Established a written schedule for health screening services Health Risk Assessent Completed and Reviewed  Exercise Activities and Dietary recommendations  Goals   None     Immunization History  Administered Date(s) Administered   Fluad Quad(high Dose 65+) 03/16/2021   Influenza Inj Mdck Quad Pf 03/20/2017   Influenza Split 02/23/2010, 03/04/2011   Influenza,inj,Quad PF,6+ Mos 03/12/2013, 03/15/2014, 03/14/2015   Influenza,inj,quad, With Preservative 03/26/2016   Influenza-Unspecified 03/01/2018, 05/15/2019   PFIZER(Purple Top)SARS-COV-2 Vaccination 09/04/2019, 09/25/2019   PNEUMOCOCCAL CONJUGATE-20 07/22/2021   Td 07/21/2020   Tdap 03/04/2011   Zoster Recombinat (Shingrix) 07/14/2017, 09/21/2017   Zoster, Live 06/23/2012    Health Maintenance  Topic Date Due   HIV Screening  Never done   COVID-19 Vaccine (3 - Booster for Pfizer series) 11/20/2019   COLONOSCOPY (Pts 45-96yrs Insurance coverage will need to be confirmed)  07/13/2027   TETANUS/TDAP  07/21/2030   Pneumonia Vaccine 22+ Years old  Completed   INFLUENZA VACCINE  Completed   Hepatitis C Screening  Completed   Zoster Vaccines- Shingrix  Completed   HPV VACCINES  Aged Out     Discussed health benefits of physical activity, and encouraged him to engage in regular exercise appropriate for his age and condition.   1. Medicare annual wellness visit, initial  - EKG 12-Lead  2. Need for pneumococcal vaccination  - Pneumococcal conjugate  vaccine 20-valent (Prevnar 20)   I, Peter Mans, MD, have reviewed all documentation for this visit. The documentation on 07/25/21 for the exam, diagnosis, procedures, and orders are all accurate and complete.    Reagyn Facemire Wendelyn Breslow, MD  [facility] [phone] (phone) [phone] (fax)  [facility]

## 2021-09-03 ENCOUNTER — Telehealth: Payer: Self-pay | Admitting: Family Medicine

## 2021-09-03 ENCOUNTER — Other Ambulatory Visit: Payer: Self-pay

## 2021-09-03 MED ORDER — LEVOTHYROXINE SODIUM 25 MCG PO TABS: 25.0000 ug | ORAL_TABLET | Freq: Every day | ORAL | 2 refills | Status: DC

## 2021-09-03 NOTE — Telephone Encounter (Signed)
[facility] faxed refill request for the following medications: ? ? ?levothyroxine (EUTHYROX) 25 MCG tablet  ? ?Please advise. ? ?

## 2021-10-05 DIAGNOSIS — H10439 Chronic follicular conjunctivitis, unspecified eye: Secondary | ICD-10-CM | POA: Diagnosis not present

## 2021-10-07 DIAGNOSIS — H10439 Chronic follicular conjunctivitis, unspecified eye: Secondary | ICD-10-CM | POA: Diagnosis not present

## 2021-10-12 DIAGNOSIS — H10439 Chronic follicular conjunctivitis, unspecified eye: Secondary | ICD-10-CM | POA: Diagnosis not present

## 2021-10-19 ENCOUNTER — Ambulatory Visit (INDEPENDENT_AMBULATORY_CARE_PROVIDER_SITE_OTHER): Payer: Medicare Other | Admitting: Family Medicine

## 2021-10-19 VITALS — BP 122/89 | HR 75 | Temp 97.5°F | Wt 200.0 lb

## 2021-10-19 DIAGNOSIS — N400 Enlarged prostate without lower urinary tract symptoms: Secondary | ICD-10-CM

## 2021-10-19 DIAGNOSIS — E785 Hyperlipidemia, unspecified: Secondary | ICD-10-CM | POA: Diagnosis not present

## 2021-10-19 DIAGNOSIS — E039 Hypothyroidism, unspecified: Secondary | ICD-10-CM

## 2021-10-19 NOTE — Patient Instructions (Signed)
Take Metamucil daily to help lower cholesterol ?

## 2021-10-19 NOTE — Progress Notes (Signed)
?  ? ? ?Established patient visit ? ? ?Patient: Peter Duncan   DOB: Mar 06, 1956   66 y.o. Male  MRN: [id number] ?Visit Date: 10/19/2021 ? ?Today's healthcare provider: [name], MD  ? ?No chief complaint on file. ? ?Subjective  ?  ?HPI  ?Patient is a 66 year old male who presents today for follow up of chronic health.  He was last seen for his Welcome to Medicare exam on 07/22/21.  Today he is requesting labs to be done.  ?He feels well and has no complaints.  He has a history of BPH hyperlipidemia and hypothyroidism.  He has some very mild constipation. ?Medications: ?Outpatient Medications Prior to Visit  ?Medication Sig  ? fexofenadine (ALLEGRA) 180 MG tablet Take 180 mg by mouth daily.  ? levothyroxine (EUTHYROX) 25 MCG tablet Take 1 tablet (25 mcg total) by mouth daily before breakfast.  ? Multiple Vitamins-Minerals (MULTIVITAMIN WITH MINERALS) tablet Take 1 tablet by mouth daily.  ? Probiotic Product (PROBIOTIC DAILY PO) Take 1 capsule by mouth daily.  ? loratadine-pseudoephedrine (CLARITIN-D 24-HOUR) 10-240 MG 24 hr tablet TAKE 1 TABLET BY MOUTH ONCE DAILY (Patient not taking: Reported on 10/19/2021)  ? ?No facility-administered medications prior to visit.  ? ? ?Review of Systems  ?Respiratory:  Negative for cough, shortness of breath and wheezing.   ?Cardiovascular:  Negative for chest pain, palpitations and leg swelling.  ? ?Last thyroid functions ?Lab Results  ?Component Value Date  ? TSH 3.630 10/19/2021  ? ?  ?  Objective  ?  ?BP 122/89 (BP Location: Left Arm, Patient Position: Sitting, Cuff Size: Normal)   Pulse 75   Temp (!) 97.5 ?F (36.4 ?C) (Oral)   Wt 200 lb (90.7 kg)   SpO2 96%   BMI 27.89 kg/m?  ?BP Readings from Last 3 Encounters:  ?10/19/21 122/89  ?07/22/21 125/86  ?07/21/20 103/83  ? ?Wt Readings from Last 3 Encounters:  ?10/19/21 200 lb (90.7 kg)  ?07/22/21 204 lb (92.5 kg)  ?07/21/20 203 lb (92.1 kg)  ? ?  ? ?Physical Exam ?Vitals reviewed.  ?Constitutional:   ?   General: He  is not in acute distress. ?   Appearance: He is well-developed.  ?HENT:  ?   Head: Normocephalic and atraumatic.  ?   Right Ear: Hearing normal.  ?   Left Ear: Hearing normal.  ?   Nose: Nose normal.  ?Eyes:  ?   General: Lids are normal. No scleral icterus.    ?   Right eye: No discharge.     ?   Left eye: No discharge.  ?   Conjunctiva/sclera: Conjunctivae normal.  ?Cardiovascular:  ?   Rate and Rhythm: Normal rate and regular rhythm.  ?   Heart sounds: Normal heart sounds.  ?Pulmonary:  ?   Effort: Pulmonary effort is normal. No respiratory distress.  ?Skin: ?   Findings: No lesion or rash.  ?Neurological:  ?   General: No focal deficit present.  ?   Mental Status: He is alert and oriented to person, place, and time.  ?Psychiatric:     ?   Mood and Affect: Mood normal.     ?   Speech: Speech normal.     ?   Behavior: Behavior normal.     ?   Thought Content: Thought content normal.     ?   Judgment: Judgment normal.  ?  ? ? ?No results found for any visits on 10/19/21. ? Assessment &  Plan  ?  ? ?1. Hyperlipidemia, unspecified hyperlipidemia type ?Try Metamucil daily ?- CBC with Differential/Platelet ?- Comprehensive metabolic panel ?- Lipid Panel With LDL/HDL Ratio ? ?2. Hypothyroidism, unspecified type ?For euthyroid TSH ?- CBC with Differential/Platelet ?- Comprehensive metabolic panel ?- TSH ? ?3. Benign prostatic hyperplasia without lower urinary tract symptoms ? ?- PSA ? ? ?No follow-ups on file.  ?   ? ?I, [name], MD, have reviewed all documentation for this visit. The documentation on 10/22/21 for the exam, diagnosis, procedures, and orders are all accurate and complete. ? ? ? ?Braxten Memmer Wendelyn Breslow, MD  ?[facility] ?[phone] (phone) ?[phone] (fax) ? ? Medical Group ?

## 2021-10-20 LAB — CBC WITH DIFFERENTIAL/PLATELET
Basophils Absolute: 0 10*3/uL (ref 0.0–0.2)
Basos: 1 %
EOS (ABSOLUTE): 0.4 10*3/uL (ref 0.0–0.4)
Eos: 7 %
Hematocrit: 47.5 % (ref 37.5–51.0)
Hemoglobin: 15.7 g/dL (ref 13.0–17.7)
Immature Grans (Abs): 0 10*3/uL (ref 0.0–0.1)
Immature Granulocytes: 0 %
Lymphocytes Absolute: 2.3 10*3/uL (ref 0.7–3.1)
Lymphs: 36 %
MCH: 30.6 pg (ref 26.6–33.0)
MCHC: 33.1 g/dL (ref 31.5–35.7)
MCV: 93 fL (ref 79–97)
Monocytes Absolute: 0.6 10*3/uL (ref 0.1–0.9)
Monocytes: 9 %
Neutrophils Absolute: 3 10*3/uL (ref 1.4–7.0)
Neutrophils: 47 %
Platelets: 229 10*3/uL (ref 150–450)
RBC: 5.13 x10E6/uL (ref 4.14–5.80)
RDW: 13.1 % (ref 11.6–15.4)
WBC: 6.4 10*3/uL (ref 3.4–10.8)

## 2021-10-20 LAB — COMPREHENSIVE METABOLIC PANEL
ALT: 16 IU/L (ref 0–44)
AST: 18 IU/L (ref 0–40)
Albumin/Globulin Ratio: 1.8 (ref 1.2–2.2)
Albumin: 4.4 g/dL (ref 3.8–4.8)
Alkaline Phosphatase: 61 IU/L (ref 44–121)
BUN/Creatinine Ratio: 14 (ref 10–24)
BUN: 16 mg/dL (ref 8–27)
Bilirubin Total: 0.6 mg/dL (ref 0.0–1.2)
CO2: 25 mmol/L (ref 20–29)
Calcium: 9.8 mg/dL (ref 8.6–10.2)
Chloride: 104 mmol/L (ref 96–106)
Creatinine, Ser: 1.15 mg/dL (ref 0.76–1.27)
Globulin, Total: 2.4 g/dL (ref 1.5–4.5)
Glucose: 105 mg/dL — ABNORMAL HIGH (ref 70–99)
Potassium: 5.3 mmol/L — ABNORMAL HIGH (ref 3.5–5.2)
Sodium: 141 mmol/L (ref 134–144)
Total Protein: 6.8 g/dL (ref 6.0–8.5)
eGFR: 71 mL/min/{1.73_m2} (ref >59)

## 2021-10-20 LAB — LIPID PANEL WITH LDL/HDL RATIO
Cholesterol, Total: 217 mg/dL — ABNORMAL HIGH (ref 100–199)
HDL: 59 mg/dL (ref >39)
LDL Chol Calc (NIH): 140 mg/dL — ABNORMAL HIGH (ref 0–99)
LDL/HDL Ratio: 2.4 ratio (ref 0.0–3.6)
Triglycerides: 102 mg/dL (ref 0–149)
VLDL Cholesterol Cal: 18 mg/dL (ref 5–40)

## 2021-10-20 LAB — TSH: TSH: 3.63 u[IU]/mL (ref 0.450–4.500)

## 2021-10-20 LAB — PSA: Prostate Specific Ag, Serum: 2.3 ng/mL (ref 0.0–4.0)

## 2022-06-16 ENCOUNTER — Other Ambulatory Visit: Payer: Self-pay | Admitting: Family Medicine

## 2022-06-16 NOTE — Telephone Encounter (Signed)
Medication Refill - Medication: levothyroxine (EUTHYROX) 25 MCG tablet   Pt wife declined to schedule appointment stated pt is waiting to see Dr.Gillbert.   Has the patient contacted their pharmacy? Yes.     (Agent: If yes, when and what did the pharmacy advise?)  Preferred Pharmacy (with phone number or street name):  [place], [place] - [place]  [address] [place] [address]  Phone: [phone] Fax: [phone]  Hours: Not open 24 hours   Has the patient been seen for an appointment in the last year OR does the patient have an upcoming appointment? No.  Agent: Please be advised that RX refills may take up to 3 business days. We ask that you follow-up with your pharmacy.

## 2022-06-16 NOTE — Telephone Encounter (Signed)
Requested medication (s) are due for refill today:Yes  Requested medication (s) are on the active medication list: Yes  Last refill:  09/03/21  Future visit scheduled: No  Notes to clinic:  Patient is wanting refill, will be following Dr. Rosanna Randy to new location, has not scheduled yet and doesn't want to schedule at [facility] due to this.     Requested Prescriptions  Pending Prescriptions Disp Refills   levothyroxine (EUTHYROX) 25 MCG tablet 90 tablet 2    Sig: Take 1 tablet (25 mcg total) by mouth daily before breakfast.     Endocrinology:  Hypothyroid Agents Passed - 06/16/2022  1:27 PM      Passed - TSH in normal range and within 360 days    TSH  Date Value Ref Range Status  10/19/2021 3.630 0.450 - 4.500 uIU/mL Final         Passed - Valid encounter within last 12 months    Recent Outpatient Visits           8 months ago Hyperlipidemia, unspecified hyperlipidemia type   [facility] Jerrol Banana., MD   10 months ago Medicare annual wellness visit, initial   [facility] Jerrol Banana., MD   1 year ago Annual physical exam   [facility] Jerrol Banana., MD   2 years ago Annual physical exam   [facility] Jerrol Banana., MD   3 years ago Annual physical exam   [facility] Jerrol Banana., MD

## 2022-06-17 ENCOUNTER — Telehealth: Payer: Self-pay | Admitting: Family Medicine

## 2022-06-17 NOTE — Telephone Encounter (Signed)
Patient called to clarify the statement modified 6 week supply. He says he is planning to follow Dr. Rosanna Randy to his new location, but is not able to call to schedule an appointment until February due to his insurance and then the appointment may end up being a few weeks out. He says if he could receive a 90 day supply, that will cover him until he's seen by Dr. Rosanna Randy. I advised I will send this request to one of the providers in the office for approval, he verbalized understanding.

## 2022-06-17 NOTE — Telephone Encounter (Signed)
Patient has appointment tomorrow with Sharen Counter and will fill prescription tomorrow

## 2022-06-17 NOTE — Telephone Encounter (Signed)
Copied from [place] [id number]. Topic: General - Other >> Jun 17, 2022  9:58 AM Everette C wrote: Reason for CRM: Medication Refill - Medication: levothyroxine (EUTHYROX) 25 MCG tablet [[id number]] - the patient has called to request a modified 6 week supply   Has the patient contacted their pharmacy? Yes.  The patient has been directed to contact their PCP  (Agent: If no, request that the patient contact the pharmacy for the refill. If patient does not wish to contact the pharmacy document the reason why and proceed with request.) (Agent: If yes, when and what did the pharmacy advise?)  Preferred Pharmacy (with phone number or street name): [place], [place] - [place] [place] [address] Phone: [phone] Fax: [phone] Hours: Not open 24 hours   Has the patient been seen for an appointment in the last year OR does the patient have an upcoming appointment? Yes.    Agent: Please be advised that RX refills may take up to 3 business days. We ask that you follow-up with your pharmacy.

## 2022-06-17 NOTE — Telephone Encounter (Signed)
Spoke with pt and his wife.  Advised an appt would be needed to refill.  He stated he would be following Dr. Rosanna Randy.  I advised to get a refill until he is able to get in with Dr. Rosanna Randy he would need an appt.    Pt scheduled for 06/18/22.

## 2022-06-18 ENCOUNTER — Encounter: Payer: Self-pay | Admitting: Physician Assistant

## 2022-06-18 ENCOUNTER — Ambulatory Visit (INDEPENDENT_AMBULATORY_CARE_PROVIDER_SITE_OTHER): Payer: Medicare Other | Admitting: Physician Assistant

## 2022-06-18 VITALS — BP 120/74 | HR 89 | Temp 98.0°F | Resp 16 | Wt 202.0 lb

## 2022-06-18 DIAGNOSIS — E039 Hypothyroidism, unspecified: Secondary | ICD-10-CM | POA: Insufficient documentation

## 2022-06-18 MED ORDER — LEVOTHYROXINE SODIUM 25 MCG PO TABS: 25.0000 ug | ORAL_TABLET | Freq: Every day | ORAL | 0 refills | Status: DC

## 2022-06-18 NOTE — Progress Notes (Signed)
I,Sulibeya S Dimas,acting as a Education administrator for [employer], PA-C.,have documented all relevant documentation on the behalf of Mikey Kirschner, PA-C,as directed by  Mikey Kirschner, PA-C while in the presence of Mikey Kirschner, PA-C.     Established patient visit   Patient: Peter Duncan   DOB: Aug 14, 1955   67 y.o. Male  MRN: [id number] Visit Date: 06/18/2022  Today's healthcare provider: Mikey Kirschner, PA-C   Chief Complaint  Patient presents with   Hypothyroidism   Hyperlipidemia   Subjective    HPI  Hypothyroid, follow-up  Lab Results  Component Value Date   TSH 3.630 10/19/2021   TSH 2.840 07/21/2020   TSH 2.840 07/19/2019    Wt Readings from Last 3 Encounters:  06/18/22 202 lb (91.6 kg)  10/19/21 200 lb (90.7 kg)  07/22/21 204 lb (92.5 kg)    He was last seen for hypothyroid 6 months ago.  Management since that visit includes no changes. He reports excellent compliance with treatment. He is not having side effects.   -----------------------------------------------------------------------------------------  Medications: Outpatient Medications Prior to Visit  Medication Sig   fexofenadine (ALLEGRA) 180 MG tablet Take 180 mg by mouth daily.   levothyroxine (EUTHYROX) 25 MCG tablet Take 1 tablet (25 mcg total) by mouth daily before breakfast.   Multiple Vitamins-Minerals (MULTIVITAMIN WITH MINERALS) tablet Take 1 tablet by mouth daily.   Probiotic Product (PROBIOTIC DAILY PO) Take 1 capsule by mouth daily.   [DISCONTINUED] loratadine-pseudoephedrine (CLARITIN-D 24-HOUR) 10-240 MG 24 hr tablet TAKE 1 TABLET BY MOUTH ONCE DAILY (Patient not taking: Reported on 06/18/2022)   No facility-administered medications prior to visit.    Review of Systems  Constitutional:  Negative for appetite change, chills, fatigue and unexpected weight change.  Respiratory:  Negative for chest tightness and shortness of breath.   Cardiovascular:  Negative for chest pain,  palpitations and leg swelling.  Gastrointestinal:  Negative for abdominal pain, constipation, diarrhea, nausea and vomiting.  Endocrine: Negative for cold intolerance and heat intolerance.     Objective    BP 120/74 (BP Location: Left Arm, Patient Position: Sitting, Cuff Size: Large)   Pulse 89   Temp 98 F (36.7 C) (Oral)   Resp 16   Wt 202 lb (91.6 kg)   BMI 28.17 kg/m  BP Readings from Last 3 Encounters:  06/18/22 120/74  10/19/21 122/89  07/22/21 125/86   Wt Readings from Last 3 Encounters:  06/18/22 202 lb (91.6 kg)  10/19/21 200 lb (90.7 kg)  07/22/21 204 lb (92.5 kg)      Physical Exam Vitals reviewed.  Constitutional:      Appearance: He is not ill-appearing.  HENT:     Head: Normocephalic.  Eyes:     Conjunctiva/sclera: Conjunctivae normal.  Cardiovascular:     Rate and Rhythm: Normal rate.  Pulmonary:     Effort: Pulmonary effort is normal. No respiratory distress.  Neurological:     General: No focal deficit present.     Mental Status: He is alert and oriented to person, place, and time.  Psychiatric:        Mood and Affect: Mood normal.        Behavior: Behavior normal.      No results found for any visits on 06/18/22.  Assessment & Plan     Problem List Items Addressed This Visit       Endocrine   Hypothyroidism - Primary    Managed on levothyroxine 25 mcg on chart review has been stable  last checked 5/23 Ok to refill until he establishes with new provider      Relevant Medications   levothyroxine (EUTHYROX) 25 MCG tablet        I, Mikey Kirschner, PA-C have reviewed all documentation for this visit. The documentation on  06/18/2022  for the exam, diagnosis, procedures, and orders are all accurate and complete.  Mikey Kirschner, PA-C [facility] [address] [place], [place], [address] Office: [phone] Fax: [place]

## 2022-06-18 NOTE — Assessment & Plan Note (Signed)
Managed on levothyroxine 25 mcg on chart review has been stable last checked 5/23 Ok to refill until he establishes with new provider

## 2022-07-27 ENCOUNTER — Encounter: Payer: Medicare Other | Admitting: Family Medicine

## 2022-09-06 ENCOUNTER — Other Ambulatory Visit: Payer: Self-pay | Admitting: Physician Assistant

## 2022-09-06 DIAGNOSIS — E039 Hypothyroidism, unspecified: Secondary | ICD-10-CM

## 2022-09-16 ENCOUNTER — Telehealth: Payer: Self-pay | Admitting: Family Medicine

## 2022-09-16 NOTE — Telephone Encounter (Signed)
Called patient to schedule Medicare Annual Wellness Visit (AWV). Left message for patient to call back and schedule Medicare Annual Wellness Visit (AWV).  Last date of AWV: 07/22/2021  Please schedule an appointment at any time with [facility].  If any questions, please contact me at [phone].  Thank you ,  Bernice Cicero Care Guide [facility] AWV TEAM Direct Dial: [phone]   

## 2023-06-19 ENCOUNTER — Ambulatory Visit
Admission: RE | Admit: 2023-06-19 | Discharge: 2023-06-19 | Disposition: A | Discharge status: Home / Self Care | Payer: Medicare Other | Source: Ambulatory Visit | Attending: Emergency Medicine | Admitting: Emergency Medicine

## 2023-06-19 ENCOUNTER — Ambulatory Visit (INDEPENDENT_AMBULATORY_CARE_PROVIDER_SITE_OTHER): Payer: Medicare Other

## 2023-06-19 VITALS — BP 115/81 | HR 112 | Temp 98.3°F | Resp 16 | Ht 71.0 in | Wt 203.0 lb

## 2023-06-19 DIAGNOSIS — R0781 Pleurodynia: Secondary | ICD-10-CM

## 2023-06-19 DIAGNOSIS — R058 Other specified cough: Secondary | ICD-10-CM

## 2023-06-19 DIAGNOSIS — J209 Acute bronchitis, unspecified: Secondary | ICD-10-CM | POA: Diagnosis not present

## 2023-06-19 DIAGNOSIS — R091 Pleurisy: Secondary | ICD-10-CM

## 2023-06-19 DIAGNOSIS — J9801 Acute bronchospasm: Secondary | ICD-10-CM

## 2023-06-19 MED ORDER — AZITHROMYCIN 250 MG PO TABS: 250.0000 mg | ORAL_TABLET | Freq: Every day | ORAL | 0 refills | Status: AC

## 2023-06-19 MED ORDER — ALBUTEROL SULFATE HFA 108 (90 BASE) MCG/ACT IN AERS: 1.0000 | INHALATION_SPRAY | Freq: Four times a day (QID) | RESPIRATORY_TRACT | 0 refills | Status: AC | PRN

## 2023-06-19 MED ORDER — GUAIFENESIN-CODEINE 100-10 MG/5ML PO SOLN: 10.0000 mL | Freq: Three times a day (TID) | ORAL | 0 refills | Status: AC | PRN

## 2023-06-19 MED ORDER — PREDNISONE 20 MG PO TABS: 40.0000 mg | ORAL_TABLET | Freq: Every day | ORAL | 0 refills | Status: AC

## 2023-06-19 NOTE — ED Provider Notes (Signed)
 [facility]    CSN: [id number] Arrival date & time: 06/19/23  0909      History   Chief Complaint Chief Complaint  Patient presents with   Cough    Appointment    HPI Peter Duncan is a 68 y.o. male presenting for approximately 3-week history of productive cough of greenish sputum, fatigue, difficulty sleeping due to coughing fits and intermittent sharp left sided rib pain which is worse when coughing or breathing.  Denies fever.  Denies nasal congestion, sinus pain, sore throat, chest tightness, shortness of breath or wheezing.  No sick contacts.  Has tried multiple over-the-counter cough medications without relief.  Symptoms have not gotten any better from onset.  Patient has no history of asthma, COPD and is a non-smoker.  No other complaints.  HPI  Past Medical History:  Diagnosis Date   Hypothyroidism     Patient Active Problem List   Diagnosis Date Noted   Hypothyroidism 06/18/2022   Closed left ankle fracture 08/19/2017   Witnessed episode of apnea 05/12/2016   Abdominal hernia 05/06/2015    Past Surgical History:  Procedure Laterality Date   BONE EXCISION Left 03/08/2018   Procedure: BONE EXCISION-TIBIA;  Surgeon: Ashley Soulier, DPM;  Location: [facility];  Service: Podiatry;  Laterality: Left;   HERNIA REPAIR Bilateral    KNEE ARTHROSCOPY Left 11/25/2017   Procedure: ARTHROSCOPY KNEE, PARTIAL MEDIAL MENISECTOMY;  Surgeon: Mardee Lynwood SQUIBB, MD;  Location: [facility];  Service: Orthopedics;  Laterality: Left;   ORIF ANKLE FRACTURE Left 08/19/2017   Procedure: OPEN REDUCTION INTERNAL FIXATION (ORIF) ANKLE FRACTURE;  Surgeon: Mardee Lynwood SQUIBB, MD;  Location: [facility];  Service: Orthopedics;  Laterality: Left;   TENDON REPAIR Left 03/08/2018   Procedure: TENDON REPAIR-FLEXOR TENDON-ANKLE;  Surgeon: Ashley Soulier, DPM;  Location: [facility];  Service: Podiatry;  Laterality: Left;  GENERAL W/ POPLITEAL   VASECTOMY     WISDOM TOOTH EXTRACTION          Home Medications    Prior to Admission medications   Medication Sig Start Date End Date Taking? Authorizing Provider  albuterol  (VENTOLIN  HFA) 108 (90 Base) MCG/ACT inhaler Inhale 1-2 puffs into the lungs every 6 (six) hours as needed for wheezing or shortness of breath. 06/19/23  Yes Arvis Jolan NOVAK, PA-C  azithromycin  (ZITHROMAX ) 250 MG tablet Take 1 tablet (250 mg total) by mouth daily. Take first 2 tablets together, then 1 every day until finished. 06/19/23  Yes Arvis Jolan B, PA-C  guaiFENesin -codeine  100-10 MG/5ML syrup Take 10 mLs by mouth 3 (three) times daily as needed for cough. 06/19/23  Yes Arvis Jolan B, PA-C  levothyroxine  (SYNTHROID ) 25 MCG tablet TAKE 1 TABLET BY MOUTH ONCE DAILY BEFORE BREAKFAST 09/06/22  Yes Drubel, Manuelita, PA-C  predniSONE  (DELTASONE ) 20 MG tablet Take 2 tablets (40 mg total) by mouth daily for 5 days. 06/19/23 06/24/23 Yes Arvis Jolan NOVAK, PA-C  fexofenadine (ALLEGRA) 180 MG tablet Take 180 mg by mouth daily.    [provider]  Multiple Vitamins-Minerals (MULTIVITAMIN WITH MINERALS) tablet Take 1 tablet by mouth daily.    [provider]  Probiotic Product (PROBIOTIC DAILY PO) Take 1 capsule by mouth daily.    [provider]    Family History Family History  Problem Relation Age of Onset   Diabetes Mother    COPD Father    Heart disease Father    Melanoma Father     Social History Social History   Tobacco  Use   Smoking status: Never   Smokeless tobacco: Never  Vaping Use   Vaping status: Never Used  Substance Use Topics   Alcohol use: No   Drug use: No     Allergies   Patient has no known allergies.   Review of Systems Review of Systems  Constitutional:  Positive for fatigue. Negative for fever.  HENT:  Positive for congestion. Negative for rhinorrhea, sinus pressure, sinus pain and sore throat.   Respiratory:  Positive for cough. Negative for shortness of breath.   Cardiovascular:  Positive for  chest pain (left sided rib pain).  Gastrointestinal:  Negative for abdominal pain, diarrhea, nausea and vomiting.  Musculoskeletal:  Negative for myalgias.  Neurological:  Negative for weakness, light-headedness and headaches.  Hematological:  Negative for adenopathy.  Psychiatric/Behavioral:  Positive for sleep disturbance.      Physical Exam Triage Vital Signs ED Triage Vitals  Encounter Vitals Group     BP 06/19/23 0954 115/81     Systolic BP Percentile --      Diastolic BP Percentile --      Pulse Rate 06/19/23 0954 (!) 112     Resp 06/19/23 0954 16     Temp 06/19/23 0954 98.3 F (36.8 C)     Temp Source 06/19/23 0954 Oral     SpO2 06/19/23 0954 96 %     Weight 06/19/23 0953 203 lb (92.1 kg)     Height 06/19/23 0953 5' 11 (1.803 m)     Head Circumference --      Peak Flow --      Pain Score 06/19/23 0953 6     Pain Loc --      Pain Education --      Exclude from Growth Chart --    No data found.  Updated Vital Signs BP 115/81 (BP Location: Left Arm)   Pulse (!) 112   Temp 98.3 F (36.8 C) (Oral)   Resp 16   Ht 5' 11 (1.803 m)   Wt 203 lb (92.1 kg)   SpO2 96%   BMI 28.31 kg/m   Physical Exam Vitals and nursing note reviewed.  Constitutional:      General: He is not in acute distress.    Appearance: Normal appearance. He is well-developed. He is not ill-appearing.     Comments: Coughs frequently and holds left ribs  HENT:     Head: Normocephalic and atraumatic.     Nose: Congestion present.     Mouth/Throat:     Mouth: Mucous membranes are moist.     Pharynx: Oropharynx is clear.  Eyes:     General: No scleral icterus.    Conjunctiva/sclera: Conjunctivae normal.  Cardiovascular:     Rate and Rhythm: Regular rhythm. Tachycardia present.  Pulmonary:     Effort: Pulmonary effort is normal. No respiratory distress.     Breath sounds: Wheezing present.  Musculoskeletal:     Cervical back: Neck supple.  Skin:    General: Skin is warm and dry.      Capillary Refill: Capillary refill takes less than 2 seconds.  Neurological:     General: No focal deficit present.     Mental Status: He is alert. Mental status is at baseline.     Motor: No weakness.     Gait: Gait normal.  Psychiatric:        Mood and Affect: Mood normal.        Behavior: Behavior normal.  UC Treatments / Results  Labs (all labs ordered are listed, but only abnormal results are displayed) Labs Reviewed - No data to display  EKG   Radiology DG Chest 2 View Result Date: 06/19/2023 CLINICAL DATA:  Cough and congestion. EXAM: CHEST - 2 VIEW COMPARISON:  None Available. FINDINGS: Heart size and mediastinal contours appear normal. Asymmetric elevation of the left hemidiaphragm with overlying platelike atelectasis versus scar. No signs of pleural effusion, interstitial edema or airspace disease. The visualized osseous structures are unremarkable. IMPRESSION: Asymmetric elevation of the left hemidiaphragm with overlying platelike atelectasis versus scar. Electronically Signed   By: Waddell Calk M.D.   On: 06/19/2023 10:07    Procedures Procedures (including critical care time)  Medications Ordered in UC Medications - No data to display  Initial Impression / Assessment and Plan / UC Course  I have reviewed the triage vital signs and the nursing notes.  Pertinent labs & imaging results that were available during my care of the patient were reviewed by me and considered in my medical decision making (see chart for details).   68 year old male presents for 3-week history of productive cough, coughing fits, intermittent left-sided rib pain which is worse with breathing.  Denies shortness of breath or fever.  Patient is complaining of pleuritic pain which could represent a condition with threat to bodily harm or life.  Low suspicion for PE given no relative risk factors, normal oxygen saturation and he is not feeling short of breath.  Will assess for possible  pneumonia.  Low suspicion for cardiac condition as he has no relative risk factors.  Patient is afebrile and overall well-appearing but he does cough frequently.  Heart rate elevated at 112 bpm.  Slight nasal congestion.  Few scattered wheezes.  Chest x-ray obtained to assess for possible pneumonia given duration of illness.  Chest x-ray shows no obvious pneumonia but there is suspected atelectasis versus scar of the left side.  Seems most likely to be atelectasis since he is having pleuritic type discomfort on the left side.  Reviewed results with patient.  Acute bronchitis, pleurisy, bronchospasm.  Likely viral but given his continued symptoms which have not improved we will try course of antibiotics.  Sent azithromycin  to pharmacy.  Also sent prednisone  and ProAir  for bronchospasms.  Cheratussin sent after reviewing controlled substance could base.  Encouraged increasing rest and fluids.  We reviewed return and ER precautions.   Final Clinical Impressions(s) / UC Diagnoses   Final diagnoses:  Acute bronchitis, unspecified organism  Pleuritic pain  Productive cough  Acute bronchospasm     Discharge Instructions      -X-ray does not show pneumonia. -Your symptoms are consistent with bronchitis.  Since he been ill for 3 weeks without improvement, we can try antibiotics, corticosteroids, stronger cough medicine and an inhaler. - Bronchitis can last for 2 to 6 weeks. - Please return if fever, worsening cough, increased pain in chest or worsening breathing problem.     ED Prescriptions     Medication Sig Dispense Auth. Provider   azithromycin  (ZITHROMAX ) 250 MG tablet Take 1 tablet (250 mg total) by mouth daily. Take first 2 tablets together, then 1 every day until finished. 6 tablet Arvis Huxley B, PA-C   predniSONE  (DELTASONE ) 20 MG tablet Take 2 tablets (40 mg total) by mouth daily for 5 days. 10 tablet Arvis Huxley B, PA-C   albuterol  (VENTOLIN  HFA) 108 (90 Base) MCG/ACT inhaler  Inhale 1-2 puffs into the lungs every 6 (six)  hours as needed for wheezing or shortness of breath. 1 g Arvis Huxley B, PA-C   guaiFENesin -codeine  100-10 MG/5ML syrup Take 10 mLs by mouth 3 (three) times daily as needed for cough. 120 mL Arvis Huxley NOVAK, PA-C      I have reviewed the PDMP during this encounter.   Arvis Huxley NOVAK, PA-C 06/19/23 1042

## 2023-06-19 NOTE — Discharge Instructions (Addendum)
-  X-ray does not show pneumonia. -Your symptoms are consistent with bronchitis.  Since he been ill for 3 weeks without improvement, we can try antibiotics, corticosteroids, stronger cough medicine and an inhaler. - Bronchitis can last for 2 to 6 weeks. - Please return if fever, worsening cough, increased pain in chest or worsening breathing problem.

## 2023-06-19 NOTE — ED Triage Notes (Signed)
 Patient c/o cough and chest congestion for 3 weeks.  Patient reports low grade fevers.

## 2023-07-30 ENCOUNTER — Other Ambulatory Visit: Payer: Self-pay | Admitting: Physician Assistant

## 2023-07-30 DIAGNOSIS — E039 Hypothyroidism, unspecified: Secondary | ICD-10-CM

## 2023-08-17 ENCOUNTER — Ambulatory Visit
Admission: RE | Admit: 2023-08-17 | Discharge: 2023-08-17 | Disposition: A | Discharge status: Home / Self Care | Source: Ambulatory Visit | Attending: Emergency Medicine | Admitting: Emergency Medicine

## 2023-08-17 VITALS — BP 113/78 | HR 133 | Temp 99.0°F | Resp 18

## 2023-08-17 DIAGNOSIS — U071 COVID-19: Secondary | ICD-10-CM

## 2023-08-17 LAB — RESP PANEL BY RT-PCR (FLU A&B, COVID) ARPGX2
Influenza A by PCR: NEGATIVE
Influenza B by PCR: NEGATIVE
SARS Coronavirus 2 by RT PCR: POSITIVE — AB

## 2023-08-17 MED ORDER — MOLNUPIRAVIR EUA 200MG CAPSULE: 4.0000 | ORAL_CAPSULE | Freq: Two times a day (BID) | ORAL | 0 refills | Status: AC

## 2023-08-17 NOTE — ED Triage Notes (Signed)
 Pt presents with a cough, runny nose, headache, sore throat and fever since yesterday.

## 2023-08-17 NOTE — ED Provider Notes (Signed)
 [facility]    CSN: [id number] Arrival date & time: 08/17/23  1048      History   Chief Complaint Chief Complaint  Patient presents with   Fever    Headache, aches, cough - Entered by patient   Cough   Sore Throat   Headache    HPI Peter Duncan is a 68 y.o. male.   68 year old male pt, Peter Duncan, presents to urgent care for evaluation of sudden onset of cough, runny nose, headache, sore throat and fever since yesterday.  No known illness exposure, did recently go out to eat over the weekend. Pt is taking OTC meds for symptoms.   The history is provided by the patient. No language interpreter was used.    Past Medical History:  Diagnosis Date   Hypothyroidism     Patient Active Problem List   Diagnosis Date Noted   COVID 08/17/2023   Hypothyroidism 06/18/2022   Closed left ankle fracture 08/19/2017   Witnessed episode of apnea 05/12/2016   Abdominal hernia 05/06/2015    Past Surgical History:  Procedure Laterality Date   BONE EXCISION Left 03/08/2018   Procedure: BONE EXCISION-TIBIA;  Surgeon: [name], DPM;  Location: [facility];  Service: Podiatry;  Laterality: Left;   HERNIA REPAIR Bilateral    KNEE ARTHROSCOPY Left 11/25/2017   Procedure: ARTHROSCOPY KNEE, PARTIAL MEDIAL MENISECTOMY;  Surgeon: [name], MD;  Location: [facility];  Service: Orthopedics;  Laterality: Left;   ORIF ANKLE FRACTURE Left 08/19/2017   Procedure: OPEN REDUCTION INTERNAL FIXATION (ORIF) ANKLE FRACTURE;  Surgeon: [name], MD;  Location: [facility];  Service: Orthopedics;  Laterality: Left;   TENDON REPAIR Left 03/08/2018   Procedure: TENDON REPAIR-FLEXOR TENDON-ANKLE;  Surgeon: [name], DPM;  Location: [facility];  Service: Podiatry;  Laterality: Left;  GENERAL W/ POPLITEAL   VASECTOMY     WISDOM TOOTH EXTRACTION         Home Medications    Prior to Admission medications   Medication Sig Start Date End Date Taking?  Authorizing Provider  molnupiravir EUA (LAGEVRIO) 200 mg CAPS capsule Take 4 capsules (800 mg total) by mouth 2 (two) times daily for 5 days. 08/17/23 08/22/23 Yes Kamora Vossler, Para March, NP  rosuvastatin (CRESTOR) 10 MG tablet Take 1 tablet by mouth daily. 08/15/23  Yes [provider]  albuterol (VENTOLIN HFA) 108 (90 Base) MCG/ACT inhaler Inhale 1-2 puffs into the lungs every 6 (six) hours as needed for wheezing or shortness of breath. 06/19/23   Shirlee Latch, PA-C  azithromycin (ZITHROMAX) 250 MG tablet Take 1 tablet (250 mg total) by mouth daily. Take first 2 tablets together, then 1 every day until finished. 06/19/23   Shirlee Latch, PA-C  fexofenadine (ALLEGRA) 180 MG tablet Take 180 mg by mouth daily.    [provider]  guaiFENesin-codeine 100-10 MG/5ML syrup Take 10 mLs by mouth 3 (three) times daily as needed for cough. 06/19/23   Shirlee Latch, PA-C  levothyroxine (SYNTHROID) 25 MCG tablet TAKE 1 TABLET BY MOUTH ONCE DAILY BEFORE BREAKFAST 09/06/22   Alfredia Ferguson, PA-C  Multiple Vitamins-Minerals (MULTIVITAMIN WITH MINERALS) tablet Take 1 tablet by mouth daily.    [provider]  Probiotic Product (PROBIOTIC DAILY PO) Take 1 capsule by mouth daily.    [provider]    Family History Family History  Problem Relation Age of Onset   Diabetes Mother    COPD Father    Heart  disease Father    Melanoma Father     Social History Social History   Tobacco Use   Smoking status: Never   Smokeless tobacco: Never  Vaping Use   Vaping status: Never Used  Substance Use Topics   Alcohol use: No   Drug use: No     Allergies   Patient has no known allergies.   Review of Systems Review of Systems  Constitutional:  Positive for fever.  HENT:  Positive for congestion, rhinorrhea and sore throat.   Respiratory:  Positive for cough.   Neurological:  Positive for headaches.  All other systems reviewed and are negative.    Physical Exam Triage  Vital Signs ED Triage Vitals  Encounter Vitals Group     BP 08/17/23 1127 113/78     Systolic BP Percentile --      Diastolic BP Percentile --      Pulse Rate 08/17/23 1127 (!) 133     Resp 08/17/23 1127 18     Temp 08/17/23 1127 99 F (37.2 C)     Temp Source 08/17/23 1127 Oral     SpO2 08/17/23 1127 93 %     Weight --      Height --      Head Circumference --      Peak Flow --      Pain Score 08/17/23 1126 0     Pain Loc --      Pain Education --      Exclude from Growth Chart --    No data found.  Updated Vital Signs BP 113/78 (BP Location: Right Arm)   Pulse (!) 133   Temp 99 F (37.2 C) (Oral)   Resp 18   SpO2 93%   Visual Acuity Right Eye Distance:   Left Eye Distance:   Bilateral Distance:    Right Eye Near:   Left Eye Near:    Bilateral Near:     Physical Exam Vitals and nursing note reviewed.  Constitutional:      General: He is not in acute distress.    Appearance: He is well-developed. He is not ill-appearing or toxic-appearing.  HENT:     Head: Normocephalic.     Right Ear: Tympanic membrane is retracted.     Left Ear: Tympanic membrane is retracted.     Nose: Mucosal edema and congestion present.     Mouth/Throat:     Lips: Pink.     Mouth: Mucous membranes are moist.     Pharynx: Oropharynx is clear. Uvula midline.  Eyes:     General: Lids are normal.     Conjunctiva/sclera: Conjunctivae normal.     Pupils: Pupils are equal, round, and reactive to light.  Cardiovascular:     Rate and Rhythm: Normal rate and regular rhythm.     Heart sounds: Normal heart sounds.  Pulmonary:     Effort: Pulmonary effort is normal. No respiratory distress.     Breath sounds: Normal breath sounds and air entry. No decreased breath sounds or wheezing.  Abdominal:     General: There is no distension.     Palpations: Abdomen is soft.  Musculoskeletal:        General: Normal range of motion.     Cervical back: Normal range of motion.  Skin:    General: Skin  is warm and dry.     Findings: No rash.  Neurological:     General: No focal deficit present.  Mental Status: He is alert and oriented to person, place, and time.     GCS: GCS eye subscore is 4. GCS verbal subscore is 5. GCS motor subscore is 6.     Cranial Nerves: No cranial nerve deficit.     Sensory: No sensory deficit.  Psychiatric:        Attention and Perception: Attention normal.        Mood and Affect: Mood normal.        Speech: Speech normal.        Behavior: Behavior normal. Behavior is cooperative.      UC Treatments / Results  Labs (all labs ordered are listed, but only abnormal results are displayed) Labs Reviewed  RESP PANEL BY RT-PCR (FLU A&B, COVID) ARPGX2 - Abnormal; Notable for the following components:      Result Value   SARS Coronavirus 2 by RT PCR POSITIVE (*)    All other components within normal limits    EKG   Radiology No results found.  Procedures Procedures (including critical care time)  Medications Ordered in UC Medications - No data to display  Initial Impression / Assessment and Plan / UC Course  I have reviewed the triage vital signs and the nursing notes.  Pertinent labs & imaging results that were available during my care of the patient were reviewed by me and considered in my medical decision making (see chart for details).  Clinical Course as of 08/17/23 2058  Wed Aug 17, 2023  1130 Flu/covid test pending [JD]    Clinical Course User Index [JD] Jacqulin Brandenburger, Para March, NP  Discussed exam findings and plan of care with patient, scripted Lagevrio for COVID as patient is on statin, strict go to ER precautions given(chest pain, shortness of breath, palpitations, unable to keep meds or fluids down , worsening symptoms,etc).  Both patient and wife verbalized understanding this provider  Ddx: Covid, viral illness, allergies Final Clinical Impressions(s) / UC Diagnoses   Final diagnoses:  COVID     Discharge Instructions       We have scripted Lagevrio, for covid, take as prescribed.    The recommendations suggest returning to normal activities when, for at least 24 hours, symptoms are improving overall, and if a fever was present, it has been gone without use of a fever-reducing medication.  Once people resume normal activities, they are encouraged to take additional prevention strategies for the next 5 days to curb disease spread, such as taking more steps for cleaner air, enhancing hygiene practices, wearing a well-fitting mask, keeping a distance from others, and/or getting tested for respiratory viruses  If you develop chest pain shortness of breath or worsening symptoms go to the emergency room for further evaluation.    ED Prescriptions     Medication Sig Dispense Auth. Provider   molnupiravir EUA (LAGEVRIO) 200 mg CAPS capsule Take 4 capsules (800 mg total) by mouth 2 (two) times daily for 5 days. 40 capsule Geoffrey Hynes, Para March, NP      PDMP not reviewed this encounter.   Clancy Gourd, NP 08/17/23 2100

## 2023-08-17 NOTE — Discharge Instructions (Addendum)
 We have scripted Lagevrio, for covid, take as prescribed.    The recommendations suggest returning to normal activities when, for at least 24 hours, symptoms are improving overall, and if a fever was present, it has been gone without use of a fever-reducing medication.  Once people resume normal activities, they are encouraged to take additional prevention strategies for the next 5 days to curb disease spread, such as taking more steps for cleaner air, enhancing hygiene practices, wearing a well-fitting mask, keeping a distance from others, and/or getting tested for respiratory viruses  If you develop chest pain shortness of breath or worsening symptoms go to the emergency room for further evaluation.

## 2023-09-21 ENCOUNTER — Ambulatory Visit: Payer: Self-pay

## 2023-09-21 DIAGNOSIS — Z1211 Encounter for screening for malignant neoplasm of colon: Secondary | ICD-10-CM | POA: Diagnosis present

## 2023-09-21 DIAGNOSIS — K635 Polyp of colon: Secondary | ICD-10-CM | POA: Diagnosis not present
